# Patient Record
Sex: Female | Born: 1975 | Race: White | Hispanic: No | Marital: Married | Smoking: Never smoker
Health system: Southern US, Community
[De-identification: ages and names within clinical notes are randomized; demographics above are authoritative.]

## PROBLEM LIST (undated history)

## (undated) DIAGNOSIS — K219 Gastro-esophageal reflux disease without esophagitis: Secondary | ICD-10-CM

---

## 2004-05-30 ENCOUNTER — Other Ambulatory Visit: Admission: RE | Admit: 2004-05-30 | Discharge: 2004-05-30 | Payer: Self-pay | Admitting: Obstetrics and Gynecology

## 2005-11-01 ENCOUNTER — Inpatient Hospital Stay (HOSPITAL_COMMUNITY): Admission: AD | Admit: 2005-11-01 | Discharge: 2005-11-05 | Payer: Self-pay | Admitting: Obstetrics and Gynecology

## 2010-09-06 LAB — RUBELLA ANTIBODY, IGM: Rubella: IMMUNE

## 2010-09-06 LAB — ABO/RH: RH Type: POSITIVE

## 2010-09-06 LAB — GC/CHLAMYDIA PROBE AMP, GENITAL: Gonorrhea: NEGATIVE

## 2011-03-26 ENCOUNTER — Encounter (HOSPITAL_COMMUNITY)
Admission: RE | Admit: 2011-03-26 | Discharge: 2011-03-26 | Disposition: A | Discharge status: Home / Self Care | Payer: BC Managed Care – PPO | Source: Ambulatory Visit | Attending: Obstetrics and Gynecology | Admitting: Obstetrics and Gynecology

## 2011-03-26 ENCOUNTER — Encounter (HOSPITAL_COMMUNITY): Payer: Self-pay

## 2011-03-26 HISTORY — DX: Gastro-esophageal reflux disease without esophagitis: K21.9

## 2011-03-26 LAB — CBC
HCT: 41.5 % (ref 36.0–46.0)
MCHC: 33.3 g/dL (ref 30.0–36.0)
RDW: 13.1 % (ref 11.5–15.5)

## 2011-03-26 LAB — SURGICAL PCR SCREEN: MRSA, PCR: NEGATIVE

## 2011-03-26 NOTE — Patient Instructions (Addendum)
YOUR PROCEDURE IS SCHEDULED ON: 03/29/11  ENTER THROUGH THE MAIN ENTRANCE OF [facility] AT:6am  USE DESK PHONE AND DIAL [phone] TO INFORM us OF YOUR ARRIVAL  CALL [phone] IF YOU HAVE ANY QUESTIONS OR PROBLEMS PRIOR TO YOUR ARRIVAL.  REMEMBER: DO NOT EAT OR DRINK AFTER MIDNIGHT :Wed  SPECIAL INSTRUCTIONS:   YOU MAY BRUSH YOUR TEETH THE MORNING OF SURGERY   TAKE THESE MEDICINES THE DAY OF SURGERY WITH SIP OF WATER: none   DO NOT WEAR JEWELRY, EYE MAKEUP, LIPSTICK OR DARK FINGERNAIL POLISH DO NOT WEAR LOTIONS  DO NOT SHAVE FOR 48 HOURS PRIOR TO SURGERY  YOU WILL NOT BE ALLOWED TO DRIVE YOURSELF HOME.  NAME OF DRIVER: Elijah Birk- spouse

## 2011-03-27 LAB — RPR: RPR Ser Ql: NONREACTIVE

## 2011-03-27 NOTE — H&P (Addendum)
Tracy Owen is a 36 y.o. female G2P1001 at 39+weeks (EDD 04/05/11 by 9 week [place]) presenting for scheduled repeat c-section, declining TOL.  Prenatal course has been releatively uneventful.  SHe had some fundal lag, but US showed baby at the 24%ile with normal AFI on 03/13/11.  She also had a placenta previa that resolved on a 28 week scan.  History OB History    Grav Para Term Preterm Abortions TAB SAB Ect Mult Living   2 1        1     LTCS 2007  NRFHR 7#  Past Medical History  Diagnosis Date  . GERD (gastroesophageal reflux disease)     no meds- pregnancy related   Past Surgical History  Procedure Date  . Cesarean section 2007   Family History: family history is not on file. Social History:  reports that she has never smoked. She does not have any smokeless tobacco history on file. She reports that she does not use illicit drugs. Her alcohol history not on file.  ROS    Last menstrual period 07/04/2010. Maternal Exam:  Uterine Assessment: Contraction strength is mild.  Contraction frequency is irregular.   Abdomen: Patient reports no abdominal tenderness. Fetal presentation: vertex  Introitus: Normal vulva. Normal vagina.    Physical Exam  Constitutional: She is oriented to person, place, and time. She appears well-developed and well-nourished.  Cardiovascular: Normal rate and regular rhythm.   Respiratory: Effort normal and breath sounds normal.  GI: Soft. Bowel sounds are normal.  Genitourinary: Vagina normal.       Cervix 50/1/-2  Neurological: She is alert and oriented to person, place, and time.  Psychiatric: She has a normal mood and affect. Her behavior is normal.    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  Risks of bleeding, infection and possible damage to bowel and bladder d/w her.  She desires to proceed.  Oliver Pila 03/27/2011, 11:10 PM   Per patient no changes in dictated H&P and brief exam WNL.

## 2011-03-28 MED ORDER — CEFAZOLIN SODIUM-DEXTROSE 2-3 GM-% IV SOLR
2.0000 g | INTRAVENOUS | Status: AC
  Administered 2011-03-29: 2 g via INTRAVENOUS
  Filled 2011-03-28: qty 50

## 2011-03-29 ENCOUNTER — Encounter (HOSPITAL_COMMUNITY): Payer: Self-pay | Admitting: *Deleted

## 2011-03-29 ENCOUNTER — Inpatient Hospital Stay (HOSPITAL_COMMUNITY): Payer: BC Managed Care – PPO | Admitting: Anesthesiology

## 2011-03-29 ENCOUNTER — Inpatient Hospital Stay (HOSPITAL_COMMUNITY)
Admission: RE | Admit: 2011-03-29 | Discharge: 2011-03-31 | DRG: 371 | Disposition: A | Discharge status: Home / Self Care | Payer: BC Managed Care – PPO | Source: Ambulatory Visit | Attending: Obstetrics and Gynecology | Admitting: Obstetrics and Gynecology

## 2011-03-29 ENCOUNTER — Encounter (HOSPITAL_COMMUNITY)
Admission: RE | Disposition: A | Discharge status: Home / Self Care | Payer: Self-pay | Source: Ambulatory Visit | Attending: Obstetrics and Gynecology

## 2011-03-29 ENCOUNTER — Encounter (HOSPITAL_COMMUNITY): Payer: Self-pay | Admitting: Anesthesiology

## 2011-03-29 DIAGNOSIS — Z98891 History of uterine scar from previous surgery: Secondary | ICD-10-CM

## 2011-03-29 DIAGNOSIS — O34219 Maternal care for unspecified type scar from previous cesarean delivery: Principal | ICD-10-CM | POA: Diagnosis present

## 2011-03-29 LAB — ABO/RH: ABO/RH(D): O POS

## 2011-03-29 LAB — TYPE AND SCREEN: ABO/RH(D): O POS

## 2011-03-29 SURGERY — Surgical Case
Anesthesia: Choice | Wound class: Clean Contaminated

## 2011-03-29 MED ORDER — OXYTOCIN 10 UNIT/ML IJ SOLN
INTRAMUSCULAR | Status: AC
  Filled 2011-03-29: qty 2

## 2011-03-29 MED ORDER — FENTANYL CITRATE 0.05 MG/ML IJ SOLN
INTRAMUSCULAR | Status: AC
  Filled 2011-03-29: qty 2

## 2011-03-29 MED ORDER — SIMETHICONE 80 MG PO CHEW: 80.0000 mg | CHEWABLE_TABLET | ORAL | Status: DC | PRN

## 2011-03-29 MED ORDER — SIMETHICONE 80 MG PO CHEW
80.0000 mg | CHEWABLE_TABLET | Freq: Three times a day (TID) | ORAL | Status: DC
  Administered 2011-03-29 – 2011-03-31 (×6): 80 mg via ORAL

## 2011-03-29 MED ORDER — IBUPROFEN 600 MG PO TABS
600.0000 mg | ORAL_TABLET | Freq: Four times a day (QID) | ORAL | Status: DC | PRN
  Filled 2011-03-29 (×4): qty 1

## 2011-03-29 MED ORDER — SCOPOLAMINE 1 MG/3DAYS TD PT72
1.0000 | MEDICATED_PATCH | Freq: Once | TRANSDERMAL | Status: DC
  Administered 2011-03-29: 1.5 mg via TRANSDERMAL

## 2011-03-29 MED ORDER — PROMETHAZINE HCL 25 MG/ML IJ SOLN: 6.2500 mg | INTRAMUSCULAR | Status: DC | PRN

## 2011-03-29 MED ORDER — KETOROLAC TROMETHAMINE 30 MG/ML IJ SOLN
30.0000 mg | Freq: Four times a day (QID) | INTRAMUSCULAR | Status: AC | PRN
  Administered 2011-03-29: 30 mg via INTRAMUSCULAR

## 2011-03-29 MED ORDER — ONDANSETRON HCL 4 MG/2ML IJ SOLN
INTRAMUSCULAR | Status: DC | PRN
  Administered 2011-03-29: 4 mg via INTRAVENOUS

## 2011-03-29 MED ORDER — ONDANSETRON HCL 4 MG/2ML IJ SOLN: 4.0000 mg | INTRAMUSCULAR | Status: DC | PRN

## 2011-03-29 MED ORDER — MIDAZOLAM HCL 2 MG/2ML IJ SOLN: 0.5000 mg | Freq: Once | INTRAMUSCULAR | Status: DC | PRN

## 2011-03-29 MED ORDER — DIPHENHYDRAMINE HCL 50 MG/ML IJ SOLN
12.5000 mg | INTRAMUSCULAR | Status: DC | PRN
  Administered 2011-03-29: 12.5 mg via INTRAVENOUS
  Filled 2011-03-29: qty 1

## 2011-03-29 MED ORDER — MEPERIDINE HCL 25 MG/ML IJ SOLN
6.2500 mg | INTRAMUSCULAR | Status: DC | PRN
  Administered 2011-03-29: 6.25 mg via INTRAVENOUS

## 2011-03-29 MED ORDER — DIPHENHYDRAMINE HCL 25 MG PO CAPS
25.0000 mg | ORAL_CAPSULE | ORAL | Status: DC | PRN
  Administered 2011-03-30: 25 mg via ORAL
  Filled 2011-03-29: qty 1

## 2011-03-29 MED ORDER — KETOROLAC TROMETHAMINE 30 MG/ML IJ SOLN: 30.0000 mg | Freq: Four times a day (QID) | INTRAMUSCULAR | Status: AC | PRN

## 2011-03-29 MED ORDER — ONDANSETRON HCL 4 MG PO TABS: 4.0000 mg | ORAL_TABLET | ORAL | Status: DC | PRN

## 2011-03-29 MED ORDER — PRENATAL MULTIVITAMIN CH
1.0000 | ORAL_TABLET | Freq: Every day | ORAL | Status: DC
  Administered 2011-03-31: 1 via ORAL
  Filled 2011-03-29: qty 1

## 2011-03-29 MED ORDER — PHENYLEPHRINE HCL 10 MG/ML IJ SOLN
INTRAMUSCULAR | Status: DC | PRN
  Administered 2011-03-29 (×2): 40 ug via INTRAVENOUS

## 2011-03-29 MED ORDER — 0.9 % SODIUM CHLORIDE (POUR BTL) OPTIME
TOPICAL | Status: DC | PRN
  Administered 2011-03-29: 1000 mL

## 2011-03-29 MED ORDER — ACETAMINOPHEN 10 MG/ML IV SOLN
1000.0000 mg | Freq: Four times a day (QID) | INTRAVENOUS | Status: DC | PRN
  Filled 2011-03-29: qty 100

## 2011-03-29 MED ORDER — OXYTOCIN 10 UNIT/ML IJ SOLN
INTRAMUSCULAR | Status: DC | PRN
  Administered 2011-03-29: 20 [IU]

## 2011-03-29 MED ORDER — KETOROLAC TROMETHAMINE 30 MG/ML IJ SOLN
INTRAMUSCULAR | Status: AC
  Administered 2011-03-29: 30 mg via INTRAMUSCULAR
  Filled 2011-03-29: qty 1

## 2011-03-29 MED ORDER — FENTANYL CITRATE 0.05 MG/ML IJ SOLN
INTRAMUSCULAR | Status: DC | PRN
  Administered 2011-03-29: 25 ug via INTRATHECAL

## 2011-03-29 MED ORDER — METOCLOPRAMIDE HCL 5 MG/ML IJ SOLN: 10.0000 mg | Freq: Three times a day (TID) | INTRAMUSCULAR | Status: DC | PRN

## 2011-03-29 MED ORDER — IBUPROFEN 600 MG PO TABS
600.0000 mg | ORAL_TABLET | Freq: Four times a day (QID) | ORAL | Status: DC
  Administered 2011-03-29 – 2011-03-31 (×8): 600 mg via ORAL
  Filled 2011-03-29 (×4): qty 1

## 2011-03-29 MED ORDER — OXYTOCIN 10 UNIT/ML IJ SOLN
INTRAMUSCULAR | Status: AC
  Filled 2011-03-29: qty 1

## 2011-03-29 MED ORDER — WITCH HAZEL-GLYCERIN EX PADS: 1.0000 "application " | MEDICATED_PAD | CUTANEOUS | Status: DC | PRN

## 2011-03-29 MED ORDER — ONDANSETRON HCL 4 MG/2ML IJ SOLN: 4.0000 mg | Freq: Three times a day (TID) | INTRAMUSCULAR | Status: DC | PRN

## 2011-03-29 MED ORDER — FENTANYL CITRATE 0.05 MG/ML IJ SOLN: 25.0000 ug | INTRAMUSCULAR | Status: DC | PRN

## 2011-03-29 MED ORDER — MEPERIDINE HCL 25 MG/ML IJ SOLN
INTRAMUSCULAR | Status: AC
  Administered 2011-03-29: 6.25 mg via INTRAVENOUS
  Filled 2011-03-29: qty 1

## 2011-03-29 MED ORDER — ACETAMINOPHEN 325 MG PO TABS: 325.0000 mg | ORAL_TABLET | ORAL | Status: DC | PRN

## 2011-03-29 MED ORDER — NALBUPHINE HCL 10 MG/ML IJ SOLN
5.0000 mg | INTRAMUSCULAR | Status: DC | PRN
  Filled 2011-03-29: qty 1

## 2011-03-29 MED ORDER — ZOLPIDEM TARTRATE 5 MG PO TABS: 5.0000 mg | ORAL_TABLET | Freq: Every evening | ORAL | Status: DC | PRN

## 2011-03-29 MED ORDER — MEPERIDINE HCL 25 MG/ML IJ SOLN: 6.2500 mg | INTRAMUSCULAR | Status: DC | PRN

## 2011-03-29 MED ORDER — DIBUCAINE 1 % RE OINT: 1.0000 "application " | TOPICAL_OINTMENT | RECTAL | Status: DC | PRN

## 2011-03-29 MED ORDER — LANOLIN HYDROUS EX OINT: 1.0000 "application " | TOPICAL_OINTMENT | CUTANEOUS | Status: DC | PRN

## 2011-03-29 MED ORDER — MENTHOL 3 MG MT LOZG: 1.0000 | LOZENGE | OROMUCOSAL | Status: DC | PRN

## 2011-03-29 MED ORDER — MORPHINE SULFATE 0.5 MG/ML IJ SOLN
INTRAMUSCULAR | Status: AC
  Filled 2011-03-29: qty 10

## 2011-03-29 MED ORDER — LACTATED RINGERS IV SOLN
INTRAVENOUS | Status: DC
  Administered 2011-03-29 (×2): via INTRAVENOUS

## 2011-03-29 MED ORDER — OXYTOCIN 20 UNITS IN LACTATED RINGERS INFUSION - SIMPLE: 125.0000 mL/h | INTRAVENOUS | Status: AC

## 2011-03-29 MED ORDER — PHENYLEPHRINE 40 MCG/ML (10ML) SYRINGE FOR IV PUSH (FOR BLOOD PRESSURE SUPPORT)
PREFILLED_SYRINGE | INTRAVENOUS | Status: AC
  Filled 2011-03-29: qty 5

## 2011-03-29 MED ORDER — LACTATED RINGERS IV SOLN
INTRAVENOUS | Status: DC
  Administered 2011-03-29: 07:00:00 via INTRAVENOUS

## 2011-03-29 MED ORDER — SENNOSIDES-DOCUSATE SODIUM 8.6-50 MG PO TABS
2.0000 | ORAL_TABLET | Freq: Every day | ORAL | Status: DC
  Administered 2011-03-29 – 2011-03-30 (×2): 2 via ORAL

## 2011-03-29 MED ORDER — ONDANSETRON HCL 4 MG/2ML IJ SOLN
INTRAMUSCULAR | Status: AC
  Filled 2011-03-29: qty 2

## 2011-03-29 MED ORDER — DIPHENHYDRAMINE HCL 50 MG/ML IJ SOLN: 25.0000 mg | INTRAMUSCULAR | Status: DC | PRN

## 2011-03-29 MED ORDER — OXYCODONE-ACETAMINOPHEN 5-325 MG PO TABS
1.0000 | ORAL_TABLET | ORAL | Status: DC | PRN
  Administered 2011-03-31: 1 via ORAL
  Filled 2011-03-29: qty 1

## 2011-03-29 MED ORDER — SODIUM CHLORIDE 0.9 % IV SOLN
1.0000 ug/kg/h | INTRAVENOUS | Status: DC | PRN
  Filled 2011-03-29: qty 2.5

## 2011-03-29 MED ORDER — LACTATED RINGERS IV SOLN
INTRAVENOUS | Status: DC
  Administered 2011-03-29: 16:00:00 via INTRAVENOUS

## 2011-03-29 MED ORDER — NALOXONE HCL 0.4 MG/ML IJ SOLN: 0.4000 mg | INTRAMUSCULAR | Status: DC | PRN

## 2011-03-29 MED ORDER — DIPHENHYDRAMINE HCL 25 MG PO CAPS
25.0000 mg | ORAL_CAPSULE | Freq: Four times a day (QID) | ORAL | Status: DC | PRN
  Administered 2011-03-29: 25 mg via ORAL
  Filled 2011-03-29: qty 1

## 2011-03-29 MED ORDER — SODIUM CHLORIDE 0.9 % IJ SOLN: 3.0000 mL | INTRAMUSCULAR | Status: DC | PRN

## 2011-03-29 MED ORDER — MORPHINE SULFATE (PF) 0.5 MG/ML IJ SOLN
INTRAMUSCULAR | Status: DC | PRN
  Administered 2011-03-29: .15 mg via INTRATHECAL

## 2011-03-29 MED ORDER — TETANUS-DIPHTH-ACELL PERTUSSIS 5-2.5-18.5 LF-MCG/0.5 IM SUSP
0.5000 mL | Freq: Once | INTRAMUSCULAR | Status: AC
  Administered 2011-03-30: 0.5 mL via INTRAMUSCULAR
  Filled 2011-03-29 (×2): qty 0.5

## 2011-03-29 SURGICAL SUPPLY — 41 items
APL SKNCLS STERI-STRIP NONHPOA (GAUZE/BANDAGES/DRESSINGS) ×1
BENZOIN TINCTURE PRP APPL 2/3 (GAUZE/BANDAGES/DRESSINGS) ×1 IMPLANT
CHLORAPREP W/TINT 26ML (MISCELLANEOUS) ×2 IMPLANT
CLOTH BEACON ORANGE TIMEOUT ST (SAFETY) ×2 IMPLANT
CONTAINER PREFILL 10% NBF 15ML (MISCELLANEOUS) IMPLANT
DRSG COVADERM 4X10 (GAUZE/BANDAGES/DRESSINGS) ×1 IMPLANT
ELECT REM PT RETURN 9FT ADLT (ELECTROSURGICAL) ×2
ELECTRODE REM PT RTRN 9FT ADLT (ELECTROSURGICAL) ×1 IMPLANT
EXTRACTOR VACUUM KIWI (MISCELLANEOUS) IMPLANT
EXTRACTOR VACUUM M CUP 4 TUBE (SUCTIONS) IMPLANT
GAUZE SPONGE 4X4 12PLY STRL LF (GAUZE/BANDAGES/DRESSINGS) ×1 IMPLANT
GLOVE BIO SURGEON STRL SZ 6.5 (GLOVE) ×4 IMPLANT
GLOVE BIO SURGEON STRL SZ8 (GLOVE) ×3 IMPLANT
GLOVE SURG ORTHO 8.0 STRL STRW (GLOVE) ×1 IMPLANT
GOWN PREVENTION PLUS LG XLONG (DISPOSABLE) ×4 IMPLANT
GOWN SURG XXL (GOWNS) ×1 IMPLANT
GOWN SURGICAL XLG (GOWNS) ×2 IMPLANT
KIT ABG SYR 3ML LUER SLIP (SYRINGE) IMPLANT
NEEDLE HYPO 25X5/8 SAFETYGLIDE (NEEDLE) ×2 IMPLANT
NS IRRIG 1000ML POUR BTL (IV SOLUTION) ×2 IMPLANT
PACK C SECTION WH (CUSTOM PROCEDURE TRAY) ×2 IMPLANT
PAD ABD 7.5X8 STRL (GAUZE/BANDAGES/DRESSINGS) ×2 IMPLANT
RTRCTR C-SECT PINK 25CM LRG (MISCELLANEOUS) ×2 IMPLANT
SLEEVE SCD COMPRESS KNEE MED (MISCELLANEOUS) IMPLANT
STAPLER VISISTAT 35W (STAPLE) IMPLANT
STRIP CLOSURE SKIN 1/2X4 (GAUZE/BANDAGES/DRESSINGS) ×1 IMPLANT
SUT CHROMIC 1 CTX 36 (SUTURE) ×4 IMPLANT
SUT PLAIN 0 NONE (SUTURE) IMPLANT
SUT PLAIN 2 0 XLH (SUTURE) IMPLANT
SUT VIC AB 0 CT1 27 (SUTURE) ×4
SUT VIC AB 0 CT1 27XBRD ANBCTR (SUTURE) ×2 IMPLANT
SUT VIC AB 2-0 CT1 27 (SUTURE)
SUT VIC AB 2-0 CT1 TAPERPNT 27 (SUTURE) IMPLANT
SUT VIC AB 3-0 CT1 27 (SUTURE) ×2
SUT VIC AB 3-0 CT1 TAPERPNT 27 (SUTURE) IMPLANT
SUT VIC AB 3-0 SH 27 (SUTURE) ×4
SUT VIC AB 3-0 SH 27X BRD (SUTURE) IMPLANT
SUT VIC AB 4-0 KS 27 (SUTURE) IMPLANT
TOWEL OR 17X24 6PK STRL BLUE (TOWEL DISPOSABLE) ×4 IMPLANT
TRAY FOLEY CATH 14FR (SET/KITS/TRAYS/PACK) ×2 IMPLANT
WATER STERILE IRR 1000ML POUR (IV SOLUTION) ×2 IMPLANT

## 2011-03-29 NOTE — Op Note (Signed)
Operative note  Preoperative diagnosis Term pregnancy at 39 weeks Prior C-section declines VBAC  Postoperative diagnosis Same  Procedure Repeat low transverse C-section with 2 layer closure of uterus  Surgeon Dr. Huel Cote Dr. Tracey Harries  Anesthesia Spinal  Fluids Estimated blood loss 600 cc Urine output 250 cc IV fluid 2200 cc  Findings A viable female infant in the vertex presentation. Apgars were 8 and 9. Weight pending at the time of dictation. Patient had normal uterus tubes and ovaries noted. The lower uterine segment was quite thin and did require reinforcing at the midline of the incision.  Specimen Placenta sent to L&D  Procedure note After informed consent was obtained from the patient she was taken to the operating room where spinal anesthesia was obtained without difficulty. She was then prepped and draped in the normal sterile fashion in the dorsal supine position with a leftward tilt and an appropriate time out performed. A Pfannenstiel skin incision was then made through pre-existing scar and carried through to the underlying layer of fascia by sharp dissection and Bovie cautery. The fascia was then nicked in the midline and the incision was extended laterally with Mayo scissors. The inferior aspect of the incision was grasped with Coker clamps elevated and dissected off the underlying rectus muscles. In a similar fashion the superior aspect was dissected off the rectus muscles. These were separated in the midline and the peritoneal cavity entered sharply. Peritoneal incision was then extended both superiorly and inferiorly with careful attention to avoid both bowel bladder. The Alexis self-retaining wound retractor was placed within the incision and the lower uterine segment was exposed nicely. The lower uterine segment was then incised in a transverse fashion after the bladder flap was created with Metzenbaum scissors. The uterine cavity itself was entered  bluntly and the infant's head was delivered atraumatically. Nose and mouth bulb suctioned and the remainder of the body easily delivered with the cord clamped and cut and infant handed to the waiting pediatricians. The uterus was then massaged and the placenta expressed spontaneously and handed off to pathology. The uterus was also cleaned of all clots and debris with moist lap sponge. The uterus was somewhat boggy but did respond quickly to IV Pitocin and massage. The incision was then closed with 1-0 chromic in a running locked fashion in the first layer and a second layer of the same suture imbricating. At the midline of the incision it was noted that there was some pulling through the lower uterine segment where it was quite thin. This was additionally reinforced with 2 layers of 2-0 Vicryl on SH needle and good approximation noted. All appeared hemostatic so the tubes and ovaries were inspected bilaterally and found to be normal and the gutters cleared of all clots and debris. The incision once again appeared clear and therefore the Alexis retractor and all instruments and sponges were removed from the abdomen. The rectus muscles and peritoneum were reapproximated were several interrupted mattress sutures of 2-0 Vicryl. The fascia was closed with 0 Vicryl in a running fashion the subcutaneous tissue was reapproximated with 2 plain in a running fashion. The skin was closed with 4-0 Vicryl in a subcuticular stitch on a Keith needle. Again all instruments and sponge counts were correct and the patient was taken to the recovery room in good condition.

## 2011-03-29 NOTE — Brief Op Note (Signed)
03/29/2011  8:35 AM  PATIENT:  Tracy Owen  36 y.o. female  PRE-OPERATIVE DIAGNOSIS:  Previous cesarean section, declines VBAC                                                       Term pregnancy at 20 weeks  POST-OPERATIVE DIAGNOSIS:  same  PROCEDURE:  Procedure(s) (LRB): Repeat Low transverse CESAREAN SECTION (N/A) with two layer closure of uterus  SURGEON:  Surgeon(s) and Role:    * Oliver Pila, MD - Primary       Tracey Harries, MD-assist  ANESTHESIA:   spinal  EBL:  Total I/O In: 2000 [I.V.:2000] Out: 850 [Urine:250; Blood:600]  BLOOD ADMINISTERED:none  DRAINS: Urinary Catheter (Foley)    SPECIMEN:  Placenta  DISPOSITION OF SPECIMEN:  L&D  COUNTS:  YES  DICTATION: .Dragon Dictation  PLAN OF CARE: Admit to inpatient   PATIENT DISPOSITION:  PACU - hemodynamically stable.

## 2011-03-29 NOTE — Anesthesia Preprocedure Evaluation (Signed)
Anesthesia Evaluation  Patient identified by MRN, date of birth, ID band Patient awake    Reviewed: Allergy & Precautions, H&P , NPO status , Patient's Chart, lab work & pertinent test results  Airway Mallampati: II      Dental No notable dental hx.    Pulmonary neg pulmonary ROS,  breath sounds clear to auscultation  Pulmonary exam normal       Cardiovascular Exercise Tolerance: Good negative cardio ROS  Rhythm:regular Rate:Normal     Neuro/Psych negative neurological ROS  negative psych ROS   GI/Hepatic negative GI ROS, Neg liver ROS, GERD-  Controlled,  Endo/Other  negative endocrine ROS  Renal/GU negative Renal ROS  negative genitourinary   Musculoskeletal   Abdominal Normal abdominal exam  (+)   Peds  Hematology negative hematology ROS (+)   Anesthesia Other Findings   Reproductive/Obstetrics (+) Pregnancy                           Anesthesia Physical Anesthesia Plan  ASA: II  Anesthesia Plan: Spinal   Post-op Pain Management:    Induction:   Airway Management Planned:   Additional Equipment:   Intra-op Plan:   Post-operative Plan:   Informed Consent: I have reviewed the patients History and Physical, chart, labs and discussed the procedure including the risks, benefits and alternatives for the proposed anesthesia with the patient or authorized representative who has indicated his/her understanding and acceptance.     Plan Discussed with: Anesthesiologist, CRNA and Surgeon  Anesthesia Plan Comments:         Anesthesia Quick Evaluation

## 2011-03-29 NOTE — Anesthesia Procedure Notes (Signed)

## 2011-03-29 NOTE — Anesthesia Postprocedure Evaluation (Signed)
  Anesthesia Post-op Note  Patient: Tracy Owen  Procedure(s) Performed: Procedure(s) (LRB): CESAREAN SECTION (N/A)  Patient Location: PACU  Anesthesia Type: Spinal  Level of Consciousness: awake, alert  and oriented  Airway and Oxygen Therapy: Patient Spontanous Breathing  Post-op Pain: none  Post-op Assessment: Post-op Vital signs reviewed, Patient's Cardiovascular Status Stable, Respiratory Function Stable, Patent Airway, No signs of Nausea or vomiting and Pain level controlled  Post-op Vital Signs: Reviewed and stable  Complications: No apparent anesthesia complications

## 2011-03-29 NOTE — Transfer of Care (Signed)
Immediate Anesthesia Transfer of Care Note  Patient: Tracy Owen  Procedure(s) Performed: Procedure(s) (LRB): CESAREAN SECTION (N/A)  Patient Location: PACU  Anesthesia Type: Spinal  Level of Consciousness: awake, alert  and oriented  Airway & Oxygen Therapy: Patient Spontanous Breathing  Post-op Assessment: Report given to PACU RN and Post -op Vital signs reviewed and stable  Post vital signs: stable  Complications: No apparent anesthesia complications

## 2011-03-30 LAB — CBC
HCT: 35.4 % — ABNORMAL LOW (ref 36.0–46.0)
Hemoglobin: 11.8 g/dL — ABNORMAL LOW (ref 12.0–15.0)
MCHC: 33.3 g/dL (ref 30.0–36.0)
RBC: 3.92 MIL/uL (ref 3.87–5.11)

## 2011-03-30 NOTE — Progress Notes (Signed)
Patient ID: Tracy Owen, female   DOB: 05-Jul-1975, 36 y.o.   MRN: [id number] #1 afebrile VS normal Pt has no complaints. Hgb stable.

## 2011-03-30 NOTE — Addendum Note (Signed)
Addendum  created 03/30/11 1335 by Jhonnie Garner, CRNA   Modules edited:Notes Section

## 2011-03-30 NOTE — Anesthesia Postprocedure Evaluation (Signed)
Anesthesia Post Note  Patient: Tracy Owen  Procedure(s) Performed: Procedure(s) (LRB): CESAREAN SECTION (N/A)  Anesthesia type: SAB  Patient location: Mother/Baby  Post pain: Pain level controlled  Post assessment: Post-op Vital signs reviewed  Last Vitals:  Filed Vitals:   03/30/11 1334  BP: 120/82  Pulse: 76  Temp: 36.6 C  Resp: 19    Post vital signs: Reviewed  Level of consciousness: awake  Complications: No apparent anesthesia complications

## 2011-03-31 ENCOUNTER — Encounter (HOSPITAL_COMMUNITY): Payer: Self-pay | Admitting: Obstetrics and Gynecology

## 2011-03-31 MED ORDER — OXYCODONE-ACETAMINOPHEN 5-325 MG PO TABS: 1.0000 | ORAL_TABLET | ORAL | Status: AC | PRN

## 2011-03-31 MED ORDER — IBUPROFEN 600 MG PO TABS: 600.0000 mg | ORAL_TABLET | Freq: Four times a day (QID) | ORAL | Status: AC | PRN

## 2011-03-31 NOTE — Progress Notes (Signed)
Patient ID: Tracy Owen, female   DOB: [birth year], 36 y.o.   MRN: [id number] #2 afebrile BP 130/92 no HA or epigastric pain. Wants d/c Have advised her to take her BP 4x per day and call with HA, epigastric pain or BP > 160/100.

## 2011-03-31 NOTE — Discharge Summary (Signed)
Tracy Owen, STIEBER NO.:  [id number]  MEDICAL RECORD NO.:  [id number]  LOCATION:  9138                          FACILITY:  [facility]  PHYSICIAN:  Malachi Pro. Ambrose Mantle, M.D. DATE OF BIRTH:  April 21, 1975  DATE OF ADMISSION:  03/29/2011 DATE OF DISCHARGE:  03/31/2011                              DISCHARGE SUMMARY   This is a 36 year old white female, para 1-0-0-1, gravida 2, EDC April 05, 2011 by 9 week ultrasound, presented for scheduled repeat C-section. Declined a trial of labor.  Prenatal course was uneventful.  She had some fundal lag, but the ultrasound showed the baby at the 24th percentile with normal amniotic fluid on March 13, 2011.  She had a placenta previa that resolved on the 28-week scan.  PAST MEDICAL HISTORY:  GERD.  SURGICAL HISTORY:  C-section.  FAMILY HISTORY:  Not on file.  She has never smoked.  She did not drink or use illicit drugs.   On admission, her cervix was 1 cm, 50%, vertex, -2 station.  Blood group and type O positive, negative antibody, rubella immune, RPR nonreactive, hepatitis B surface antigen negative, HIV negative, GC and Chlamydia not reported, group B strep negative, declined genetic screens, 1-hour Glucola 149, 3-hour GTT normal, cystic fibrosis carrier, but husband negative.  On March 29, 2011, the patient underwent a low-transverse cervical C-section by Dr. Senaida Ores with Dr. Ambrose Mantle assisting under spinal anesthesia.  The uterus was closed in 2 layers.  The infant was female, Apgars were 8 and 9 at 1 and 5 minutes, and weight was not available at the time of dictation.  Postoperatively, the patient did quite well and was discharged on the 2nd postoperative day.  LABORATORY DATA:  Initial hemoglobin of 13.0 hematocrit 41.5 white count 8200, platelet count 206,000.  Followup hemoglobin 11.8.  RPR was nonreactive.  FINAL DIAGNOSES:  Intrauterine pregnancy at 39 weeks, delivered vertex by repeat C-section, prior C-section.   Declined vaginal birth after cesarean.  OPERATION:  Low-transverse cervical C-section.  FINAL CONDITION:  Improved.  The patient's last blood pressure was 130/92, so she is counseled to take her blood pressure 4 times a day, report if it is greater than 160/100 and report any headache or epigastric pain.  The patient is given our routine discharge instructions, Percocet 5/325, 15 tablets, 1 every 6 hours as needed for pain and Motrin 600 mg, 30 tablets, 1 every 6 hours as needed for pain.  The patient is advised to return to the office in 2 weeks for followup examination.  She is planning on having a Mirena inserted.  I have instructed her to avoid intercourse for 6 weeks.  To call the office and make sure her benefits are acceptable for the Mirena and to return and see Dr. Senaida Ores in 2 weeks for followup examination.     Malachi Pro. Ambrose Mantle, M.D.     TFH/MEDQ  D:  03/31/2011  T:  03/31/2011  Job:  [id number]

## 2011-03-31 NOTE — Discharge Instructions (Signed)
booklet °

## 2012-04-22 ENCOUNTER — Other Ambulatory Visit: Payer: Self-pay | Admitting: Obstetrics and Gynecology

## 2012-04-22 DIAGNOSIS — Z1231 Encounter for screening mammogram for malignant neoplasm of breast: Secondary | ICD-10-CM

## 2012-08-05 ENCOUNTER — Other Ambulatory Visit: Payer: Self-pay

## 2012-08-05 ENCOUNTER — Other Ambulatory Visit: Payer: Self-pay | Admitting: Family Medicine

## 2012-08-05 DIAGNOSIS — R300 Dysuria: Secondary | ICD-10-CM

## 2012-08-05 NOTE — Progress Notes (Signed)
Patient reports 2 days of dysuria, frequency, urgency. Urinalysis is significant for large amounts of blood, large amount of leukocyte esterase. I will send a urine culture. Meanwhile treat with Cipro 500 mg by mouth twice a day for 5 days.

## 2012-08-07 LAB — URINE CULTURE: Colony Count: >=100000

## 2013-11-02 ENCOUNTER — Encounter (HOSPITAL_COMMUNITY): Payer: Self-pay | Admitting: Obstetrics and Gynecology

## 2016-12-20 ENCOUNTER — Ambulatory Visit (INDEPENDENT_AMBULATORY_CARE_PROVIDER_SITE_OTHER): Payer: BLUE CROSS/BLUE SHIELD | Admitting: Podiatry

## 2016-12-20 ENCOUNTER — Encounter: Payer: Self-pay | Admitting: Podiatry

## 2016-12-20 VITALS — BP 113/71 | HR 88 | Resp 16

## 2016-12-20 DIAGNOSIS — L859 Epidermal thickening, unspecified: Secondary | ICD-10-CM | POA: Diagnosis not present

## 2016-12-20 DIAGNOSIS — B070 Plantar wart: Secondary | ICD-10-CM | POA: Diagnosis not present

## 2016-12-20 DIAGNOSIS — N9089 Other specified noninflammatory disorders of vulva and perineum: Secondary | ICD-10-CM | POA: Insufficient documentation

## 2016-12-20 NOTE — Progress Notes (Signed)
  Subjective:  Patient ID: Tracy Owen, female    DOB: 08-Jun-1975,  MRN: [id number] HPI Chief Complaint  Patient presents with  . Callouses    Sub 5th MPJ right - callused area x several months, tried trimming and using OTC wart meds-no help, walking makes tender    41 y.o. female presents with the above complaint.    Past Medical History:  Diagnosis Date  . GERD (gastroesophageal reflux disease)    no meds- pregnancy related     Current Outpatient Medications:  .  levonorgestrel (MIRENA, 52 MG,) 20 MCG/24HR IUD, Mirena 20 mcg/24 hr (5 years) intrauterine device  Take 1 device by intrauterine route., Disp: , Rfl:   Allergies  Allergen Reactions  . Latex   . Sulfa Antibiotics Rash   Review of Systems  All other systems reviewed and are negative.  Objective:   Vitals:   12/20/16 1543  BP: 113/71  Pulse: 88  Resp: 16    General: Well developed, nourished, in no acute distress, alert and oriented x3   Dermatological: Skin is warm, dry and supple bilateral. Nails x 10 are well maintained; remaining integument appears unremarkable at this time. There are no open sores, no preulcerative lesions, no rash or signs of infection present.  Vascular: Dorsalis Pedis artery and Posterior Tibial artery pedal pulses are 2/4 bilateral with immedate capillary fill time. Pedal hair growth present. No varicosities and no lower extremity edema present bilateral.   Neruologic: Grossly intact via light touch bilateral. Vibratory intact via tuning fork bilateral. Protective threshold with Semmes Wienstein monofilament intact to all pedal sites bilateral. Patellar and Achilles deep tendon reflexes 2+ bilateral. No Babinski or clonus noted bilateral.   Musculoskeletal: No gross boney pedal deformities bilateral. No pain, crepitus, or limitation noted with foot and ankle range of motion bilateral. Muscular strength 5/5 in all groups tested bilateral.  Gait: Unassisted, Nonantalgic.     Radiographs:  None taken  Assessment & Plan:   Assessment: Poor keratoma versus verrucoid lesion sub-fifth metatarsal head of the right foot.  Plan: We discussed etiology pathology conservative versus surgical therapies.  At this point we have decided to excise the lesion and sent for pathologic evaluation.  This was performed after 2 cc of a 50-50 mixture of Marcaine plain and lidocaine plain was injected sublingually.  The area was prepped and draped as normal sterile fashion.  Utilizing a 15 blade I circumferentially incised the lesion and curetted it separating the dermal epidermal junction.  This appears to be more of a verrucoid lesion and then a poor keratoma.  At this point applied phenol and Then Alcohol Pl., Silvadene cream Telfa pad and a dry sterile compressive dressing.  She received soaking instructions before leaving today.  I will follow-up with her in 1-2 weeks just to make sure this is healing well and we will sent for pathology.     Max T. [place], [place]

## 2016-12-20 NOTE — Patient Instructions (Signed)

## 2017-01-10 ENCOUNTER — Ambulatory Visit (INDEPENDENT_AMBULATORY_CARE_PROVIDER_SITE_OTHER): Payer: BLUE CROSS/BLUE SHIELD | Admitting: Podiatry

## 2017-01-10 ENCOUNTER — Encounter: Payer: Self-pay | Admitting: Podiatry

## 2017-01-10 DIAGNOSIS — B070 Plantar wart: Secondary | ICD-10-CM

## 2017-01-12 NOTE — Progress Notes (Signed)
She presents today for follow-up of curettage of plantar wart times 3-week states that is a little sore but doing okay.  She states that he has felt much better since we removed it.  She is referring to the area beneath the fifth metatarsal on the right foot.  Objective: Vital signs are stable alert and oriented x3.  Pulses are palpable.  Pathology report did demonstrate an involuting verruca plantaris.  Today's exam demonstrates well healing epithelializing wound beneath the fifth metatarsal head of the right foot.  It appears to be healing in very nicely there is no signs of bacterial infection.  Assessment: Well-healing surgical foot right.  Plan: Follow-up with me on an as-needed basis continue current therapies to heal this wound 100% if there is any recurrence notify us immediately.

## 2017-01-16 DIAGNOSIS — D2261 Melanocytic nevi of right upper limb, including shoulder: Secondary | ICD-10-CM | POA: Diagnosis not present

## 2017-01-16 DIAGNOSIS — L253 Unspecified contact dermatitis due to other chemical products: Secondary | ICD-10-CM | POA: Diagnosis not present

## 2017-01-16 DIAGNOSIS — L720 Epidermal cyst: Secondary | ICD-10-CM | POA: Diagnosis not present

## 2017-01-16 DIAGNOSIS — D2262 Melanocytic nevi of left upper limb, including shoulder: Secondary | ICD-10-CM | POA: Diagnosis not present

## 2017-03-19 ENCOUNTER — Encounter: Payer: Self-pay | Admitting: Podiatry

## 2017-03-19 ENCOUNTER — Ambulatory Visit (INDEPENDENT_AMBULATORY_CARE_PROVIDER_SITE_OTHER): Payer: BLUE CROSS/BLUE SHIELD | Admitting: Podiatry

## 2017-03-19 DIAGNOSIS — Q828 Other specified congenital malformations of skin: Secondary | ICD-10-CM

## 2017-03-19 NOTE — Progress Notes (Signed)
She presents today states that the wart is growing back as she refers to the plantar aspect of the fifth metatarsal of the right foot.  Objective: Vital signs are stable she is alert and oriented x3.  Pulses are palpable.  She has a small reactive hyperkeratotic lesion was debrided does not demonstrate any signs of wart to the plantar aspect of the fifth metatarsal head right foot.  This is the same area where he removed one from previously I feel we can stand to wash this for a while.  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  Plan: Debrided reactive hyperkeratosis.

## 2017-09-09 DIAGNOSIS — Z130 Encounter for screening for diseases of the blood and blood-forming organs and certain disorders involving the immune mechanism: Secondary | ICD-10-CM | POA: Diagnosis not present

## 2017-09-09 DIAGNOSIS — Z1231 Encounter for screening mammogram for malignant neoplasm of breast: Secondary | ICD-10-CM | POA: Diagnosis not present

## 2017-09-09 DIAGNOSIS — Z6833 Body mass index (BMI) 33.0-33.9, adult: Secondary | ICD-10-CM | POA: Diagnosis not present

## 2017-09-09 DIAGNOSIS — Z124 Encounter for screening for malignant neoplasm of cervix: Secondary | ICD-10-CM | POA: Diagnosis not present

## 2017-09-09 DIAGNOSIS — Z1389 Encounter for screening for other disorder: Secondary | ICD-10-CM | POA: Diagnosis not present

## 2017-09-09 DIAGNOSIS — Z01419 Encounter for gynecological examination (general) (routine) without abnormal findings: Secondary | ICD-10-CM | POA: Diagnosis not present

## 2017-09-10 DIAGNOSIS — Z124 Encounter for screening for malignant neoplasm of cervix: Secondary | ICD-10-CM | POA: Diagnosis not present

## 2017-09-10 LAB — RESULTS CONSOLE HPV: CHL HPV: NEGATIVE

## 2017-09-10 LAB — HM PAP SMEAR: HM Pap smear: NEGATIVE

## 2017-09-13 ENCOUNTER — Other Ambulatory Visit: Payer: Self-pay | Admitting: Obstetrics and Gynecology

## 2017-09-13 DIAGNOSIS — N6320 Unspecified lump in the left breast, unspecified quadrant: Secondary | ICD-10-CM

## 2017-09-19 ENCOUNTER — Ambulatory Visit
Admission: RE | Admit: 2017-09-19 | Discharge: 2017-09-19 | Disposition: A | Discharge status: Home / Self Care | Payer: BLUE CROSS/BLUE SHIELD | Source: Ambulatory Visit | Attending: Obstetrics and Gynecology | Admitting: Obstetrics and Gynecology

## 2017-09-19 ENCOUNTER — Other Ambulatory Visit: Payer: Self-pay | Admitting: Obstetrics and Gynecology

## 2017-09-19 DIAGNOSIS — N6320 Unspecified lump in the left breast, unspecified quadrant: Secondary | ICD-10-CM

## 2017-09-19 DIAGNOSIS — R922 Inconclusive mammogram: Secondary | ICD-10-CM | POA: Diagnosis not present

## 2018-02-14 ENCOUNTER — Other Ambulatory Visit: Payer: Self-pay | Admitting: Family Medicine

## 2018-02-14 MED ORDER — AMOXICILLIN 875 MG PO TABS: 875.0000 mg | ORAL_TABLET | Freq: Two times a day (BID) | ORAL | 0 refills | Status: AC

## 2018-02-19 DIAGNOSIS — D2262 Melanocytic nevi of left upper limb, including shoulder: Secondary | ICD-10-CM | POA: Diagnosis not present

## 2018-02-19 DIAGNOSIS — D225 Melanocytic nevi of trunk: Secondary | ICD-10-CM | POA: Diagnosis not present

## 2018-02-19 DIAGNOSIS — L218 Other seborrheic dermatitis: Secondary | ICD-10-CM | POA: Diagnosis not present

## 2018-02-19 DIAGNOSIS — D2261 Melanocytic nevi of right upper limb, including shoulder: Secondary | ICD-10-CM | POA: Diagnosis not present

## 2018-03-21 ENCOUNTER — Other Ambulatory Visit: Payer: Self-pay

## 2018-03-21 ENCOUNTER — Ambulatory Visit
Admission: RE | Admit: 2018-03-21 | Discharge: 2018-03-21 | Disposition: A | Discharge status: Home / Self Care | Payer: BLUE CROSS/BLUE SHIELD | Source: Ambulatory Visit | Attending: Obstetrics and Gynecology | Admitting: Obstetrics and Gynecology

## 2018-03-21 ENCOUNTER — Other Ambulatory Visit: Payer: Self-pay | Admitting: Obstetrics and Gynecology

## 2018-03-21 DIAGNOSIS — N6320 Unspecified lump in the left breast, unspecified quadrant: Secondary | ICD-10-CM

## 2018-09-23 ENCOUNTER — Other Ambulatory Visit: Payer: BLUE CROSS/BLUE SHIELD

## 2018-09-24 ENCOUNTER — Ambulatory Visit
Admission: RE | Admit: 2018-09-24 | Discharge: 2018-09-24 | Disposition: A | Discharge status: Home / Self Care | Payer: BC Managed Care – PPO | Source: Ambulatory Visit | Attending: Obstetrics and Gynecology | Admitting: Obstetrics and Gynecology

## 2018-09-24 ENCOUNTER — Other Ambulatory Visit: Payer: Self-pay

## 2018-09-24 ENCOUNTER — Other Ambulatory Visit: Payer: Self-pay | Admitting: Obstetrics and Gynecology

## 2018-09-24 ENCOUNTER — Ambulatory Visit
Admission: RE | Admit: 2018-09-24 | Discharge: 2018-09-24 | Disposition: A | Discharge status: Home / Self Care | Payer: BLUE CROSS/BLUE SHIELD | Source: Ambulatory Visit | Attending: Obstetrics and Gynecology | Admitting: Obstetrics and Gynecology

## 2018-09-24 DIAGNOSIS — N6310 Unspecified lump in the right breast, unspecified quadrant: Secondary | ICD-10-CM

## 2018-09-24 DIAGNOSIS — N6321 Unspecified lump in the left breast, upper outer quadrant: Secondary | ICD-10-CM | POA: Diagnosis not present

## 2018-09-24 DIAGNOSIS — N6320 Unspecified lump in the left breast, unspecified quadrant: Secondary | ICD-10-CM

## 2018-09-24 DIAGNOSIS — R922 Inconclusive mammogram: Secondary | ICD-10-CM | POA: Diagnosis not present

## 2018-09-24 DIAGNOSIS — N6323 Unspecified lump in the left breast, lower outer quadrant: Secondary | ICD-10-CM | POA: Diagnosis not present

## 2018-09-24 DIAGNOSIS — N6001 Solitary cyst of right breast: Secondary | ICD-10-CM | POA: Diagnosis not present

## 2018-10-07 ENCOUNTER — Ambulatory Visit (INDEPENDENT_AMBULATORY_CARE_PROVIDER_SITE_OTHER): Payer: BC Managed Care – PPO

## 2018-10-07 ENCOUNTER — Other Ambulatory Visit: Payer: Self-pay

## 2018-10-07 DIAGNOSIS — Z23 Encounter for immunization: Secondary | ICD-10-CM

## 2018-10-29 DIAGNOSIS — Z130 Encounter for screening for diseases of the blood and blood-forming organs and certain disorders involving the immune mechanism: Secondary | ICD-10-CM | POA: Diagnosis not present

## 2018-10-29 DIAGNOSIS — Z1389 Encounter for screening for other disorder: Secondary | ICD-10-CM | POA: Diagnosis not present

## 2018-10-29 DIAGNOSIS — Z6831 Body mass index (BMI) 31.0-31.9, adult: Secondary | ICD-10-CM | POA: Diagnosis not present

## 2018-10-29 DIAGNOSIS — Z01419 Encounter for gynecological examination (general) (routine) without abnormal findings: Secondary | ICD-10-CM | POA: Diagnosis not present

## 2018-11-11 ENCOUNTER — Encounter: Payer: BLUE CROSS/BLUE SHIELD | Admitting: Family Medicine

## 2018-12-08 ENCOUNTER — Encounter: Payer: BC Managed Care – PPO | Admitting: Family Medicine

## 2019-03-10 ENCOUNTER — Encounter: Payer: BC Managed Care – PPO | Admitting: Family Medicine

## 2019-03-24 ENCOUNTER — Encounter: Payer: Self-pay | Admitting: Podiatry

## 2019-03-24 ENCOUNTER — Ambulatory Visit: Payer: 59 | Admitting: Podiatry

## 2019-03-24 ENCOUNTER — Other Ambulatory Visit: Payer: Self-pay

## 2019-03-24 VITALS — Temp 96.8°F

## 2019-03-24 DIAGNOSIS — Q828 Other specified congenital malformations of skin: Secondary | ICD-10-CM

## 2019-03-24 DIAGNOSIS — M7751 Other enthesopathy of right foot: Secondary | ICD-10-CM

## 2019-03-24 NOTE — Progress Notes (Signed)
She presents today for follow-up of her right foot with a reoccurring painful lesion.  She states that it is started back about 6 months ago and has been painful ever since.  Objective: Vital signs are stable she is alert and oriented x3 pulses are palpable.  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  Reactive hyperkeratotic lesion/porokeratosis.  Plan: Discussed etiology pathology conservative versus surgical therapies I went ahead and injected the area today for bursitis with 2 mg of dexamethasone and local anesthetic she tolerated that procedure well I also went ahead and performed some chemical destruction after mechanical destruction.  And I discussed conservative padding options with her if this fails we may need to consider orthotics.

## 2019-04-10 ENCOUNTER — Telehealth: Payer: Self-pay | Admitting: Podiatry

## 2019-04-10 NOTE — Telephone Encounter (Signed)
I don't have my account number handy, but I was calling about a bill. More like an insurance coding question related to services with Dr. Milinda Pointer on 03/23 of this year. If you can call me back, I would appreciate it. My number is [phone]. Thank you.

## 2019-04-17 ENCOUNTER — Telehealth: Payer: Self-pay | Admitting: Podiatry

## 2019-04-17 NOTE — Telephone Encounter (Signed)
My account number is [id number] and I had talked to you last week about my insurance covering or aknowledging one of the procedure codes Dr. Milinda Pointer used on my foot. I think you were going to talk to him about possibly modifying the code so insurance would possibly pay for this. Just wanted to follow up on that. If you would call me back at [phone] I would appreciate it. Thank you. Bye bye.

## 2019-05-05 ENCOUNTER — Ambulatory Visit: Payer: 59 | Admitting: Podiatry

## 2019-05-11 ENCOUNTER — Encounter: Payer: 59 | Admitting: Family Medicine

## 2019-05-14 ENCOUNTER — Other Ambulatory Visit: Payer: Self-pay

## 2019-05-14 ENCOUNTER — Ambulatory Visit (INDEPENDENT_AMBULATORY_CARE_PROVIDER_SITE_OTHER): Payer: 59 | Admitting: Family Medicine

## 2019-05-14 ENCOUNTER — Encounter: Payer: Self-pay | Admitting: Family Medicine

## 2019-05-14 VITALS — BP 106/70 | HR 82 | Temp 98.3°F | Resp 12 | Ht 65.5 in | Wt 169.0 lb

## 2019-05-14 DIAGNOSIS — M67431 Ganglion, right wrist: Secondary | ICD-10-CM | POA: Insufficient documentation

## 2019-05-14 DIAGNOSIS — Z800 Family history of malignant neoplasm of digestive organs: Secondary | ICD-10-CM | POA: Diagnosis not present

## 2019-05-14 DIAGNOSIS — Z0001 Encounter for general adult medical examination with abnormal findings: Secondary | ICD-10-CM

## 2019-05-14 DIAGNOSIS — Z0000 Encounter for general adult medical examination without abnormal findings: Secondary | ICD-10-CM

## 2019-05-14 DIAGNOSIS — I781 Nevus, non-neoplastic: Secondary | ICD-10-CM | POA: Insufficient documentation

## 2019-05-14 DIAGNOSIS — Z1211 Encounter for screening for malignant neoplasm of colon: Secondary | ICD-10-CM

## 2019-05-14 NOTE — Progress Notes (Signed)
Subjective:    Patient ID: Tracy Owen, female    DOB: [birth year], 44 y.o.   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  Is recommended that she have repeat diagnostic mammogram of the left breast ultrasound in September 2021   GYN Dr. Paula Compton  - PAP Smear UTD, no abnormal PAP Smear     -  Mirena IUD in place for contraception     Dentist every  6 months, no major issues   Eye doctor - for glasses    Followed by podiatry Dr. Milinda Pointer, for bursitis of 5th right digit , and hyperakotic lesion, recommended orthotics as needed    [facility] yearly    Walking, exercise regularly       Father has had multiple pre cancerous polyps removed, recommended that she have early  Colonoscopy - has family history of colon cancer in maternal aunt       Dr. Benson [place]    Need biometric screening labs   She has a ganglion cyst on her right wrist that has been present for quite some time but she wants to defer on having this surgically removed at this time  Review Of Systems:  GEN- denies fatigue, fever, weight loss,weakness, recent illness HEENT- denies eye drainage, change in vision, nasal discharge, CVS- denies chest pain, palpitations RESP- denies SOB, cough, wheeze ABD- denies N/V, change in stools, abd pain GU- denies dysuria, hematuria, dribbling, incontinence MSK- denies joint pain, muscle aches, injury Neuro- denies headache, dizziness, syncope, seizure activity       Objective:    BP 106/70 (BP Location: Right Arm, Patient Position: Sitting, Cuff Size: Normal)   Pulse 82   Temp 98.3 F (36.8 C) (Temporal)   Resp 12   Ht 5' 5.5" (1.664 m)   Wt 169 lb (76.7 kg)   SpO2 100%   BMI 27.70 kg/m  GEN- NAD, alert and  oriented x3 HEENT- PERRL, EOMI, non injected sclera, pink conjunctiva, MMM, oropharynx clear Neck- Supple, no thyromegaly CVS- RRR, no murmur RESP-CTAB ABD-NABS,soft,NT,ND EXT- No edema ,spider veins bilat LE Psych- normal affect and mood  MSK- Right hand - ganglion cyst at wrist, FROM Wrist  Pulses- Radial, DP- 2+  CAGE/FALL/DEPRESSION screen neg       Assessment & Plan:      Problem List Items Addressed This Visit    None    Visit Diagnoses    Routine general medical examination at a health care facility    -  Primary   CPE done, fasting labs obtained, biometric screening form to be done, referral for colonoscopy   I will obtain records from her GYN for PAP Smear   Repeat mammogram in Sept  No current pain or swelling from spider veins,will monitor for now, can use compression hose as well   Ganglion cyst on right wrist, pt prefers to hold on treatment for now    Relevant Orders   CBC with Differential/Platelet   Comprehensive metabolic panel   Lipid panel   TSH   Family history of colon cancer       Relevant Orders   Ambulatory referral to Gastroenterology   Colon cancer screening  Relevant Orders   Ambulatory referral to Gastroenterology      Note: This dictation was prepared with Dragon dictation along with smaller phrase technology. Any transcriptional errors that result from this process are unintentional.

## 2019-05-14 NOTE — Patient Instructions (Addendum)
Repeat [facility] mammogram and left ultrasound in September 2021 Referral to GI for colonoscopy  F/U 1 year for physical

## 2019-05-15 LAB — CBC WITH DIFFERENTIAL/PLATELET
Absolute Monocytes: 414 cells/uL (ref 200–950)
Basophils Absolute: 41 cells/uL (ref 0–200)
Basophils Relative: 0.9 %
Eosinophils Absolute: 59 cells/uL (ref 15–500)
Eosinophils Relative: 1.3 %
HCT: 42.3 % (ref 35.0–45.0)
Hemoglobin: 14 g/dL (ref 11.7–15.5)
Lymphs Abs: 1233 cells/uL (ref 850–3900)
MCH: 31 pg (ref 27.0–33.0)
MCHC: 33.1 g/dL (ref 32.0–36.0)
MCV: 93.8 fL (ref 80.0–100.0)
MPV: 12 fL (ref 7.5–12.5)
Monocytes Relative: 9.2 %
Neutro Abs: 2754 cells/uL (ref 1500–7800)
Neutrophils Relative %: 61.2 %
Platelets: 223 10*3/uL (ref 140–400)
RBC: 4.51 10*6/uL (ref 3.80–5.10)
RDW: 12.6 % (ref 11.0–15.0)
Total Lymphocyte: 27.4 %
WBC: 4.5 10*3/uL (ref 3.8–10.8)

## 2019-05-15 LAB — COMPREHENSIVE METABOLIC PANEL
AG Ratio: 1.8 (calc) (ref 1.0–2.5)
ALT: 20 U/L (ref 6–29)
AST: 17 U/L (ref 10–30)
Albumin: 4.5 g/dL (ref 3.6–5.1)
Alkaline phosphatase (APISO): 76 U/L (ref 31–125)
BUN: 13 mg/dL (ref 7–25)
CO2: 26 mmol/L (ref 20–32)
Calcium: 9.6 mg/dL (ref 8.6–10.2)
Chloride: 104 mmol/L (ref 98–110)
Creat: 0.64 mg/dL (ref 0.50–1.10)
Globulin: 2.5 g/dL (calc) (ref 1.9–3.7)
Glucose, Bld: 86 mg/dL (ref 65–99)
Potassium: 4.3 mmol/L (ref 3.5–5.3)
Sodium: 139 mmol/L (ref 135–146)
Total Bilirubin: 0.7 mg/dL (ref 0.2–1.2)
Total Protein: 7 g/dL (ref 6.1–8.1)

## 2019-05-15 LAB — LIPID PANEL
Cholesterol: 106 mg/dL (ref <200)
HDL: 43 mg/dL — ABNORMAL LOW (ref >=50)
LDL Cholesterol (Calc): 50 mg/dL (calc)
Non-HDL Cholesterol (Calc): 63 mg/dL (calc) (ref <130)
Total CHOL/HDL Ratio: 2.5 (calc) (ref <5.0)
Triglycerides: 53 mg/dL (ref <150)

## 2019-05-15 LAB — TSH: TSH: 1.53 mIU/L

## 2019-07-09 ENCOUNTER — Ambulatory Visit (INDEPENDENT_AMBULATORY_CARE_PROVIDER_SITE_OTHER): Payer: No Typology Code available for payment source | Admitting: Nurse Practitioner

## 2019-07-09 ENCOUNTER — Other Ambulatory Visit: Payer: Self-pay

## 2019-07-09 VITALS — BP 110/68 | HR 79 | Temp 98.4°F | Resp 18 | Wt 156.0 lb

## 2019-07-09 DIAGNOSIS — S39012A Strain of muscle, fascia and tendon of lower back, initial encounter: Secondary | ICD-10-CM | POA: Diagnosis not present

## 2019-07-09 DIAGNOSIS — M5440 Lumbago with sciatica, unspecified side: Secondary | ICD-10-CM | POA: Diagnosis not present

## 2019-07-09 MED ORDER — PREDNISONE 10 MG PO TABS: ORAL_TABLET | ORAL | 0 refills | Status: DC

## 2019-07-09 NOTE — Progress Notes (Signed)
Established Patient Office Visit  Subjective:  Patient ID: Tracy Owen, female    DOB: April 28, 1975  Age: 44 y.o. MRN: [id number]  CC:  Chief Complaint  Patient presents with  . Back Pain    reaching into car and felt like a spasm in back, getting worst, pain radiating down R leg with some numbness, ibuprofen was taken, started 07/06    HPI Tracy Owen is a 44 year old female presenting for symptoms of right lumbar pain that started Tuesday after bending over to get something out of her vehicle. The pain radiates down into her right leg. The sxs has continued and is the same today. She has tried Ibuprofen every 6-8 hours at 800 mg and using a heating pad with temporary relief. Today the sxs have gotten to the point that she came to clinic for attention, wondering if there may be something else that can be done to help relieve the sxs and further more resolve her sxs. No other sxs reported such as change in gu/gi pattern, fever/chills. Past Medical History:  Diagnosis Date  . GERD (gastroesophageal reflux disease)    no meds- pregnancy related    Past Surgical History:  Procedure Laterality Date  . CESAREAN SECTION  2007  . CESAREAN SECTION  03/29/2011   Procedure: CESAREAN SECTION;  Surgeon: Logan Bores, MD;  Location: [facility];  Service: Gynecology;  Laterality: N/A;  repeat    Family History  Problem Relation Age of Onset  . Breast cancer Paternal Aunt   . Cancer Paternal Aunt        Breast and colon  . Breast cancer Paternal Aunt   . Cancer Paternal Aunt        breast  . Colonic polyp Father   . Dementia Maternal Grandmother   . Heart disease Maternal Grandfather   . Hyperlipidemia Maternal Grandfather   . Hypertension Maternal Grandfather   . COPD Paternal Grandmother   . Cancer Paternal Grandfather        lung cancer    Social History   Socioeconomic History  . Marital status: Married    Spouse name: Not on file  . Number of children: 2  . Years of  education: Not on file  . Highest education level: Not on file  Occupational History  . Not on file  Tobacco Use  . Smoking status: Never Smoker  . Smokeless tobacco: Never Used  Vaping Use  . Vaping Use: Never used  Substance and Sexual Activity  . Alcohol use: Not Currently    Comment: occ  . Drug use: No  . Sexual activity: Yes  Other Topics Concern  . Not on file  Social History Narrative  . Not on file   Social Determinants of Health   Financial Resource Strain:   . Difficulty of Paying Living Expenses:   Food Insecurity:   . Worried About Charity fundraiser in the Last Year:   . Arboriculturist in the Last Year:   Transportation Needs:   . Film/video editor (Medical):   Marland Kitchen Lack of Transportation (Non-Medical):   Physical Activity:   . Days of Exercise per Week:   . Minutes of Exercise per Session:   Stress:   . Feeling of Stress :   Social Connections:   . Frequency of Communication with Friends and Family:   . Frequency of Social Gatherings with Friends and Family:   . Attends Religious Services:   .  Active Member of Clubs or Organizations:   . Attends Archivist Meetings:   Marland Kitchen Marital Status:   Intimate Partner Violence:   . Fear of Current or Ex-Partner:   . Emotionally Abused:   Marland Kitchen Physically Abused:   . Sexually Abused:     Outpatient Medications Prior to Visit  Medication Sig Dispense Refill  . levonorgestrel (MIRENA, 52 MG,) 20 MCG/24HR IUD Mirena 20 mcg/24 hr (5 years) intrauterine device  Take 1 device by intrauterine route.     No facility-administered medications prior to visit.    Allergies  Allergen Reactions  . Latex   . Sulfa Antibiotics Rash    ROS Review of Systems  All other systems reviewed and are negative.     Objective:    Physical Exam Vitals and nursing note reviewed.  Constitutional:      General: She is not in acute distress.    Appearance: Normal appearance. She is well-developed and well-groomed.  She is not ill-appearing or toxic-appearing.  HENT:     Head: Normocephalic.     Right Ear: Hearing normal.     Left Ear: Hearing normal.  Eyes:     General: Lids are normal. Lids are everted, no foreign bodies appreciated.     Extraocular Movements: Extraocular movements intact.     Conjunctiva/sclera: Conjunctivae normal.     Pupils: Pupils are equal, round, and reactive to light.  Cardiovascular:     Rate and Rhythm: Normal rate.  Pulmonary:     Effort: Pulmonary effort is normal.  Musculoskeletal:     Cervical back: Neck supple.     Lumbar back: Tenderness present. No swelling, edema, deformity, signs of trauma, lacerations, spasms or bony tenderness. Normal range of motion. No scoliosis.       Back:     Right lower leg: No edema.     Left lower leg: No edema.  Skin:    Coloration: Skin is not jaundiced or pale.  Neurological:     General: No focal deficit present.     Mental Status: She is alert and oriented to person, place, and time.  Psychiatric:        Mood and Affect: Mood normal.        Behavior: Behavior normal. Behavior is cooperative.     BP 110/68 (BP Location: Left Arm, Patient Position: Sitting, Cuff Size: Normal)   Pulse 79   Temp 98.4 F (36.9 C) (Temporal)   Resp 18   Wt 156 lb (70.8 kg)   SpO2 99%   BMI 25.56 kg/m  Wt Readings from Last 3 Encounters:  07/09/19 156 lb (70.8 kg)  05/14/19 169 lb (76.7 kg)  03/29/11 189 lb (85.7 kg)     Health Maintenance Due  Topic Date Due  . Hepatitis C Screening  Never done    There are no preventive care reminders to display for this patient.  Lab Results  Component Value Date   TSH 1.53 05/14/2019   Lab Results  Component Value Date   WBC 4.5 05/14/2019   HGB 14.0 05/14/2019   HCT 42.3 05/14/2019   MCV 93.8 05/14/2019   PLT 223 05/14/2019   Lab Results  Component Value Date   NA 139 05/14/2019   K 4.3 05/14/2019   CO2 26 05/14/2019   GLUCOSE 86 05/14/2019   BUN 13 05/14/2019    CREATININE 0.64 05/14/2019   BILITOT 0.7 05/14/2019   AST 17 05/14/2019   ALT 20 05/14/2019   PROT  7.0 05/14/2019   CALCIUM 9.6 05/14/2019   Lab Results  Component Value Date   CHOL 106 05/14/2019   Lab Results  Component Value Date   HDL 43 (L) 05/14/2019   Lab Results  Component Value Date   LDLCALC 50 05/14/2019   Lab Results  Component Value Date   TRIG 53 05/14/2019   Lab Results  Component Value Date   CHOLHDL 2.5 05/14/2019   No results found for: HGBA1C    Assessment & Plan:   Problem List Items Addressed This Visit    None    Visit Diagnoses    Back pain of lumbar region with sciatica    -  Primary   Relevant Medications   predniSONE (DELTASONE) 10 MG tablet   Strain of lumbar region, initial encounter        Symptoms are consistant with right lumbar strain with sciatica.  You should rest but as discussed avoid staying in one position too long. Lumbar stretches be of benefit in decreasing symptoms.  Continue to use Ibuprofen as you have 600-800 mg every 8 hours on full stomach or if not possible may take with Pepcid 20 mg to protect Gastrointestinal tract. You may alternate Ibuprofen with Tylenol 1000 mg every 8 hour as needed however must be taken 4 hours apart from Ibuprofen dose.  Example if you take Ibuprofen at 8 AM you may Take Tylenol at 12 noon then Ibuprofen again at 4 PM then Tylenol again at 8PM.   Use Ice alternating Heat to help relieve discomfort.   Take prescription steroid as directed.   Educational printout uploaded to be available in [facility] for pts viewing.  Meds ordered this encounter  Medications  . predniSONE (DELTASONE) 10 MG tablet    Sig: Day one take 6 tablets oraly Day two take 5 tablets oraly Day three take 4 tablets oraly Day four take 3 tablets oraly Day five take 2 tablets oraly Day six take 1 tablet oraly    Dispense:  21 tablet    Refill:  0    Follow-up: Return if symptoms worsen or fail to improve.     Annie Main, FNP

## 2019-07-09 NOTE — Patient Instructions (Signed)
Symptoms are consistant with right lumbar strain with sciatica.  You should rest but as discussed avoid staying in one position too long. Lumbar stretches be of benefit in decreasing symptoms.  Continue to use Ibuprofen as you have 600-800 mg every 8 hours on full stomach or if not possible may take with Pepcid 20 mg to protect Gastrointestinal tract. You may alternate Ibuprofen with Tylenol 1000 mg every 8 hour as needed however must be taken 4 hours apart from Ibuprofen dose.  Example if you take Ibuprofen at 8 AM you may Take Tylenol at 12 noon then Ibuprofen again at 4 PM then Tylenol again at 8PM.   Use Ice alternating Heat to help relieve discomfort.   Take prescription steroid as directed.

## 2019-07-27 ENCOUNTER — Other Ambulatory Visit: Payer: Self-pay | Admitting: Obstetrics and Gynecology

## 2019-07-27 DIAGNOSIS — D242 Benign neoplasm of left breast: Secondary | ICD-10-CM

## 2019-09-10 ENCOUNTER — Encounter: Payer: Self-pay | Admitting: Family Medicine

## 2019-09-25 ENCOUNTER — Other Ambulatory Visit: Payer: Self-pay

## 2019-09-25 ENCOUNTER — Ambulatory Visit
Admission: RE | Admit: 2019-09-25 | Discharge: 2019-09-25 | Disposition: A | Discharge status: Home / Self Care | Payer: No Typology Code available for payment source | Source: Ambulatory Visit | Attending: Obstetrics and Gynecology | Admitting: Obstetrics and Gynecology

## 2019-09-25 DIAGNOSIS — D242 Benign neoplasm of left breast: Secondary | ICD-10-CM

## 2019-12-09 ENCOUNTER — Other Ambulatory Visit: Payer: Self-pay | Admitting: Family Medicine

## 2020-05-16 ENCOUNTER — Encounter: Payer: 59 | Admitting: Family Medicine

## 2020-07-05 ENCOUNTER — Other Ambulatory Visit: Payer: Self-pay | Admitting: Nurse Practitioner

## 2020-07-05 ENCOUNTER — Other Ambulatory Visit: Payer: Self-pay | Admitting: *Deleted

## 2020-07-05 DIAGNOSIS — N6320 Unspecified lump in the left breast, unspecified quadrant: Secondary | ICD-10-CM

## 2020-07-07 ENCOUNTER — Ambulatory Visit
Admission: RE | Admit: 2020-07-07 | Discharge: 2020-07-07 | Disposition: A | Discharge status: Home / Self Care | Payer: No Typology Code available for payment source | Source: Ambulatory Visit | Attending: Nurse Practitioner | Admitting: Nurse Practitioner

## 2020-07-07 ENCOUNTER — Other Ambulatory Visit: Payer: Self-pay

## 2020-07-07 DIAGNOSIS — N6320 Unspecified lump in the left breast, unspecified quadrant: Secondary | ICD-10-CM | POA: Insufficient documentation

## 2020-07-08 ENCOUNTER — Other Ambulatory Visit: Payer: Self-pay | Admitting: Nurse Practitioner

## 2020-07-08 DIAGNOSIS — R928 Other abnormal and inconclusive findings on diagnostic imaging of breast: Secondary | ICD-10-CM

## 2020-07-13 ENCOUNTER — Other Ambulatory Visit: Payer: Self-pay

## 2020-07-13 ENCOUNTER — Ambulatory Visit
Admission: RE | Admit: 2020-07-13 | Discharge: 2020-07-13 | Disposition: A | Discharge status: Home / Self Care | Payer: No Typology Code available for payment source | Source: Ambulatory Visit | Attending: Nurse Practitioner | Admitting: Nurse Practitioner

## 2020-07-13 DIAGNOSIS — R928 Other abnormal and inconclusive findings on diagnostic imaging of breast: Secondary | ICD-10-CM

## 2020-07-13 HISTORY — PX: BREAST BIOPSY: SHX20

## 2020-07-14 LAB — SURGICAL PATHOLOGY

## 2020-08-01 ENCOUNTER — Other Ambulatory Visit: Payer: Self-pay | Admitting: Obstetrics and Gynecology

## 2020-08-01 ENCOUNTER — Other Ambulatory Visit: Payer: Self-pay | Admitting: Nurse Practitioner

## 2020-08-01 DIAGNOSIS — Z1231 Encounter for screening mammogram for malignant neoplasm of breast: Secondary | ICD-10-CM

## 2020-08-10 ENCOUNTER — Other Ambulatory Visit: Payer: No Typology Code available for payment source

## 2020-10-03 ENCOUNTER — Other Ambulatory Visit: Payer: Self-pay

## 2020-10-03 ENCOUNTER — Ambulatory Visit
Admission: RE | Admit: 2020-10-03 | Discharge: 2020-10-03 | Disposition: A | Discharge status: Home / Self Care | Payer: No Typology Code available for payment source | Source: Ambulatory Visit | Attending: Obstetrics and Gynecology | Admitting: Obstetrics and Gynecology

## 2020-10-03 DIAGNOSIS — Z1231 Encounter for screening mammogram for malignant neoplasm of breast: Secondary | ICD-10-CM | POA: Insufficient documentation

## 2020-10-05 ENCOUNTER — Encounter: Payer: Self-pay | Admitting: Nurse Practitioner

## 2020-10-05 ENCOUNTER — Other Ambulatory Visit: Payer: Self-pay

## 2020-10-05 ENCOUNTER — Ambulatory Visit (INDEPENDENT_AMBULATORY_CARE_PROVIDER_SITE_OTHER): Payer: No Typology Code available for payment source | Admitting: Nurse Practitioner

## 2020-10-05 VITALS — BP 118/62 | HR 66 | Temp 99.5°F | Resp 14 | Ht 66.0 in | Wt 144.0 lb

## 2020-10-05 DIAGNOSIS — Z13220 Encounter for screening for lipoid disorders: Secondary | ICD-10-CM

## 2020-10-05 DIAGNOSIS — Z0000 Encounter for general adult medical examination without abnormal findings: Secondary | ICD-10-CM | POA: Diagnosis not present

## 2020-10-05 DIAGNOSIS — Z136 Encounter for screening for cardiovascular disorders: Secondary | ICD-10-CM

## 2020-10-05 NOTE — Progress Notes (Signed)
BP 118/62   Pulse 66   Temp 99.5 F (37.5 C) (Temporal)   Resp 14   Ht 5\' 6"  (1.676 m)   Wt 144 lb (65.3 kg)   LMP  (LMP Unknown)   SpO2 97%   BMI 23.24 kg/m    Subjective:    Patient ID: ALESHIA CARTELLI, female    DOB: 1975-07-05, 45 y.o.   MRN: [id number]  HPI: ANJEANETTE PETZOLD is a 45 y.o. female presenting on 10/05/2020 for comprehensive medical examination. Current medical complaints include:  None; she has been doing [place] and has lost a significant amount of weight.  She is now only using it for maintenance therapy.  She does have spider veins in her lower extremities bilaterally that are not very bothersome.  She has compression stockings but does not wear them regularly.  She currently lives with: husband, 2 children; have 1 dog LMP: none with Mirena; follows with OB/GYN  Depression Screen done today and results listed below:  Depression screen [facility] 2/9 10/05/2020 05/14/2019  Decreased Interest 0 0  Down, Depressed, Hopeless 0 0  PHQ - 2 Score 0 0  Altered sleeping - 0  Tired, decreased energy - 0  Change in appetite - 0  Feeling bad or failure about yourself  - 0  Trouble concentrating - 0  Moving slowly or fidgety/restless - 0  Suicidal thoughts - 0  PHQ-9 Score - 0  Difficult doing work/chores - Not difficult at all   Past Medical History:  Past Medical History:  Diagnosis Date   GERD (gastroesophageal reflux disease)    no meds- pregnancy related    Surgical History:  Past Surgical History:  Procedure Laterality Date   BREAST BIOPSY Left 07/13/2020   ribbon marker 9:00, BENIGN BREAST PARENCHYMA WITH PROMINENT PERIDUCTAL CHRONIC INFLAMMATION AND DUCTAL HISTIOCYTIC REACTION CONSISTENT WITH DUCT   CESAREAN SECTION  01/01/2005   CESAREAN SECTION  03/29/2011   Procedure: CESAREAN SECTION;  Surgeon: Logan Bores, MD;  Location: [facility];  Service: Gynecology;  Laterality: N/A;  repeat    Medications:  Current Outpatient Medications on File Prior to  Visit  Medication Sig   levonorgestrel (MIRENA) 20 MCG/24HR IUD Mirena 20 mcg/24 hr (5 years) intrauterine device  Take 1 device by intrauterine route.   No current facility-administered medications on file prior to visit.    Allergies:  Allergies  Allergen Reactions   Latex    Sulfa Antibiotics Rash    Social History:  Social History   Socioeconomic History   Marital status: Married    Spouse name: Not on file   Number of children: 2   Years of education: Not on file   Highest education level: Not on file  Occupational History   Not on file  Tobacco Use   Smoking status: Never   Smokeless tobacco: Never  Vaping Use   Vaping Use: Never used  Substance and Sexual Activity   Alcohol use: Not Currently    Comment: occ   Drug use: No   Sexual activity: Yes  Other Topics Concern   Not on file  Social History Narrative   Not on file   Social Determinants of Health   Financial Resource Strain: Not on file  Food Insecurity: Not on file  Transportation Needs: Not on file  Physical Activity: Not on file  Stress: Not on file  Social Connections: Not on file  Intimate Partner Violence: Not on file   Social History   Tobacco  Use  Smoking Status Never  Smokeless Tobacco Never   Social History   Substance and Sexual Activity  Alcohol Use Not Currently   Comment: occ    Family History:  Family History  Problem Relation Age of Onset   Breast cancer Paternal Aunt    Cancer Paternal Aunt        Breast and colon   Breast cancer Paternal Aunt    Cancer Paternal Aunt        breast   Colonic polyp Father    Dementia Maternal Grandmother    Heart disease Maternal Grandfather    Hyperlipidemia Maternal Grandfather    Hypertension Maternal Grandfather    COPD Paternal Grandmother    Cancer Paternal Grandfather        lung cancer    Past medical history, surgical history, medications, allergies, family history and social history reviewed with patient today and  changes made to appropriate areas of the chart.   Review of Systems  Constitutional: Negative.  Negative for fever and weight loss.  HENT: Negative.    Eyes: Negative.  Negative for blurred vision and double vision.  Respiratory: Negative.  Negative for cough and wheezing.   Cardiovascular: Negative.  Negative for chest pain, palpitations and leg swelling.  Gastrointestinal: Negative.  Negative for abdominal pain, blood in stool, constipation, diarrhea and heartburn.  Skin: Negative.  Negative for itching and rash.  Neurological: Negative.  Negative for dizziness and headaches.  Psychiatric/Behavioral: Negative.  Negative for depression.       Objective:    BP 118/62   Pulse 66   Temp 99.5 F (37.5 C) (Temporal)   Resp 14   Ht 5\' 6"  (1.676 m)   Wt 144 lb (65.3 kg)   LMP  (LMP Unknown)   SpO2 97%   BMI 23.24 kg/m   Wt Readings from Last 3 Encounters:  10/05/20 144 lb (65.3 kg)  07/09/19 156 lb (70.8 kg)  05/14/19 169 lb (76.7 kg)    Physical Exam Vitals and nursing note reviewed.  Constitutional:      General: She is not in acute distress.    Appearance: Normal appearance. She is normal weight. She is not ill-appearing or toxic-appearing.  HENT:     Head: Normocephalic and atraumatic.     Right Ear: Tympanic membrane, ear canal and external ear normal.     Left Ear: Tympanic membrane, ear canal and external ear normal.     Nose: Nose normal. No congestion or rhinorrhea.     Mouth/Throat:     Mouth: Mucous membranes are moist.     Pharynx: Oropharynx is clear. No oropharyngeal exudate.  Eyes:     General: No scleral icterus.    Extraocular Movements: Extraocular movements intact.     Pupils: Pupils are equal, round, and reactive to light.  Cardiovascular:     Rate and Rhythm: Normal rate and regular rhythm.     Pulses: Normal pulses.     Heart sounds: Normal heart sounds. No murmur heard. Pulmonary:     Effort: Pulmonary effort is normal. No respiratory distress.      Breath sounds: No wheezing or rhonchi.  Chest:     Comments: Gynecologic examination deferred using shared decision making; has upcoming OB/GYN appointment Abdominal:     General: Abdomen is flat. Bowel sounds are normal. There is no distension.     Palpations: Abdomen is soft.     Tenderness: There is no abdominal tenderness.  Musculoskeletal:  General: No swelling or tenderness. Normal range of motion.     Cervical back: Normal range of motion and neck supple. No rigidity or tenderness.     Right lower leg: No edema.     Left lower leg: No edema.     Right ankle: Swelling present. No ecchymosis. No tenderness. Normal range of motion. Normal pulse.     Left ankle: Normal.     Comments: Swelling to lateral malleolus; nontender to palpation  Skin:    General: Skin is warm and dry.     Capillary Refill: Capillary refill takes less than 2 seconds.     Coloration: Skin is not jaundiced or pale.     Findings: No erythema.  Neurological:     General: No focal deficit present.     Mental Status: She is alert and oriented to person, place, and time.     Motor: No weakness.     Gait: Gait normal.  Psychiatric:        Mood and Affect: Mood normal.        Behavior: Behavior normal.        Thought Content: Thought content normal.        Judgment: Judgment normal.      Assessment & Plan:   Problem List Items Addressed This Visit   None Visit Diagnoses     Routine general medical examination at a health care facility    -  Primary   Relevant Orders   CBC with Differential/Platelet   COMPLETE METABOLIC PANEL WITH GFR   Lipid panel   Encounter for lipid screening for cardiovascular disease       Relevant Orders   CBC with Differential/Platelet   COMPLETE METABOLIC PANEL WITH GFR   Lipid panel        Follow up plan: Return in about 1 year (around 10/05/2021) for CPE with fasting labs.   LABORATORY TESTING:  - Pap smear: done elsewhere  IMMUNIZATIONS:   - Tdap:  Tetanus vaccination status reviewed: last tetanus booster within 10 years. - Influenza: Given elsewhere - Pneumovax: Not applicable - Prevnar: Not applicable - HPV: Not applicable - Zostavax vaccine: Not applicable - [id number]-[id number] vaccine: Has had 2 doses with 1 booster; thinking about bivalent and I did recommend she get this  SCREENING: -Mammogram: Up to date  - Colonoscopy: Up to date  - Bone Density: Not applicable  -Hearing Test: Not applicable  -Spirometry: Not applicable   PATIENT COUNSELING:  Advised to avoid cigarette smoking.  I discussed with the patient that most people either abstain from alcohol or drink within safe limits (<=14/week and <=4 drinks/occasion for males, <=7/weeks and <= 3 drinks/occasion for females) and that the risk for alcohol disorders and other health effects rises proportionally with the number of drinks per week and how often a drinker exceeds daily limits.  Discussed cessation/primary prevention of drug use and availability of treatment for abuse.   Diet: Encouraged to adjust caloric intake to maintain  or achieve ideal body weight, to reduce intake of dietary saturated fat and total fat, to limit sodium intake by avoiding high sodium foods and not adding table salt, and to maintain adequate dietary potassium and calcium preferably from fresh fruits, vegetables, and low-fat dairy products.    stressed the importance of regular exercise  Injury prevention: Discussed safety belts, safety helmets, smoke detector, smoking near bedding or upholstery.   Dental health: Discussed importance of regular tooth brushing, flossing, and dental visits.  NEXT PREVENTATIVE PHYSICAL DUE IN 1 YEAR. Return in about 1 year (around 10/05/2021) for CPE with fasting labs.

## 2020-10-06 ENCOUNTER — Encounter: Payer: Self-pay | Admitting: *Deleted

## 2020-10-06 LAB — COMPLETE METABOLIC PANEL WITH GFR
AG Ratio: 2.2 (calc) (ref 1.0–2.5)
ALT: 19 U/L (ref 6–29)
AST: 15 U/L (ref 10–30)
Albumin: 4.7 g/dL (ref 3.6–5.1)
Alkaline phosphatase (APISO): 51 U/L (ref 31–125)
BUN: 15 mg/dL (ref 7–25)
CO2: 26 mmol/L (ref 20–32)
Calcium: 9.4 mg/dL (ref 8.6–10.2)
Chloride: 104 mmol/L (ref 98–110)
Creat: 0.6 mg/dL (ref 0.50–0.99)
Globulin: 2.1 g/dL (calc) (ref 1.9–3.7)
Glucose, Bld: 81 mg/dL (ref 65–99)
Potassium: 4.2 mmol/L (ref 3.5–5.3)
Sodium: 139 mmol/L (ref 135–146)
Total Bilirubin: 0.8 mg/dL (ref 0.2–1.2)
Total Protein: 6.8 g/dL (ref 6.1–8.1)
eGFR: 114 mL/min/{1.73_m2} (ref >=60)

## 2020-10-06 LAB — LIPID PANEL
Cholesterol: 137 mg/dL (ref <200)
HDL: 67 mg/dL (ref >=50)
LDL Cholesterol (Calc): 59 mg/dL (calc)
Non-HDL Cholesterol (Calc): 70 mg/dL (calc) (ref <130)
Total CHOL/HDL Ratio: 2 (calc) (ref <5.0)
Triglycerides: 40 mg/dL (ref <150)

## 2020-10-06 LAB — CBC WITH DIFFERENTIAL/PLATELET
Absolute Monocytes: 385 cells/uL (ref 200–950)
Basophils Absolute: 28 cells/uL (ref 0–200)
Basophils Relative: 0.6 %
Eosinophils Absolute: 52 cells/uL (ref 15–500)
Eosinophils Relative: 1.1 %
HCT: 41.5 % (ref 35.0–45.0)
Hemoglobin: 13.4 g/dL (ref 11.7–15.5)
Lymphs Abs: 1466 cells/uL (ref 850–3900)
MCH: 30.9 pg (ref 27.0–33.0)
MCHC: 32.3 g/dL (ref 32.0–36.0)
MCV: 95.8 fL (ref 80.0–100.0)
MPV: 10.7 fL (ref 7.5–12.5)
Monocytes Relative: 8.2 %
Neutro Abs: 2768 cells/uL (ref 1500–7800)
Neutrophils Relative %: 58.9 %
Platelets: 236 10*3/uL (ref 140–400)
RBC: 4.33 10*6/uL (ref 3.80–5.10)
RDW: 11.7 % (ref 11.0–15.0)
Total Lymphocyte: 31.2 %
WBC: 4.7 10*3/uL (ref 3.8–10.8)

## 2020-11-02 ENCOUNTER — Telehealth: Payer: Self-pay | Admitting: Nurse Practitioner

## 2020-11-02 DIAGNOSIS — J019 Acute sinusitis, unspecified: Secondary | ICD-10-CM

## 2020-11-02 DIAGNOSIS — B9689 Other specified bacterial agents as the cause of diseases classified elsewhere: Secondary | ICD-10-CM

## 2020-11-02 MED ORDER — AMOXICILLIN-POT CLAVULANATE 875-125 MG PO TABS: 1.0000 | ORAL_TABLET | Freq: Two times a day (BID) | ORAL | 0 refills | Status: AC

## 2020-11-02 NOTE — Telephone Encounter (Signed)
Received message from patient's husband.  Patient has been battling sinus infection for >2 weeks and requesting Augmentin be sent into pharmacy.  Augmentin sent in.

## 2021-07-20 ENCOUNTER — Other Ambulatory Visit: Payer: Self-pay | Admitting: Obstetrics and Gynecology

## 2021-07-20 DIAGNOSIS — Z1231 Encounter for screening mammogram for malignant neoplasm of breast: Secondary | ICD-10-CM

## 2021-10-04 ENCOUNTER — Ambulatory Visit
Admission: RE | Admit: 2021-10-04 | Discharge: 2021-10-04 | Disposition: A | Discharge status: Home / Self Care | Payer: No Typology Code available for payment source | Source: Ambulatory Visit | Attending: Obstetrics and Gynecology | Admitting: Obstetrics and Gynecology

## 2021-10-04 DIAGNOSIS — Z1231 Encounter for screening mammogram for malignant neoplasm of breast: Secondary | ICD-10-CM | POA: Diagnosis not present

## 2021-10-06 ENCOUNTER — Encounter: Payer: No Typology Code available for payment source | Admitting: Nurse Practitioner

## 2022-01-19 IMAGING — US US BREAST*L* LIMITED INC AXILLA
1 series · 9 of 9 positions shown · non-contrast
Comparison: Previous exam(s).

ACR Breast Density Category a: The breast tissue is almost entirely
fatty.

CLINICAL DATA: Patient presents for palpable abnormality within the
slightly medial left breast.

EXAM:
DIGITAL DIAGNOSTIC UNILATERAL LEFT MAMMOGRAM WITH TOMOSYNTHESIS AND
CAD; ULTRASOUND LEFT BREAST LIMITED
TECHNIQUE: Left digital diagnostic mammography and breast tomosynthesis was
performed. The images were evaluated with computer-aided detection.;
Targeted ultrasound examination of the left breast was performed

[Series 1: us breast*left* limited inc axilla · 0.07mm/px · 9 of 9 slices shown]
[im 1/9]
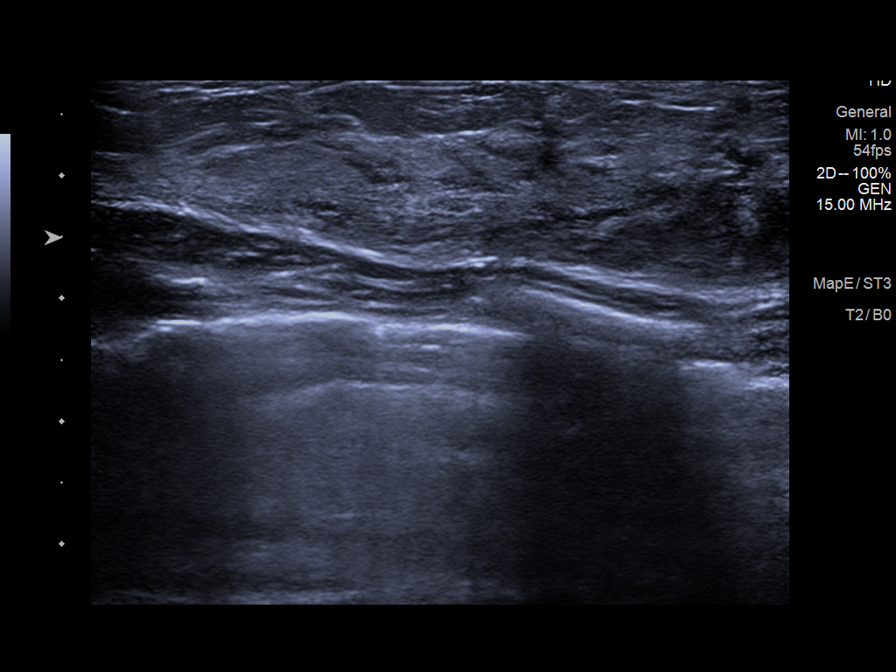
[im 2/9]
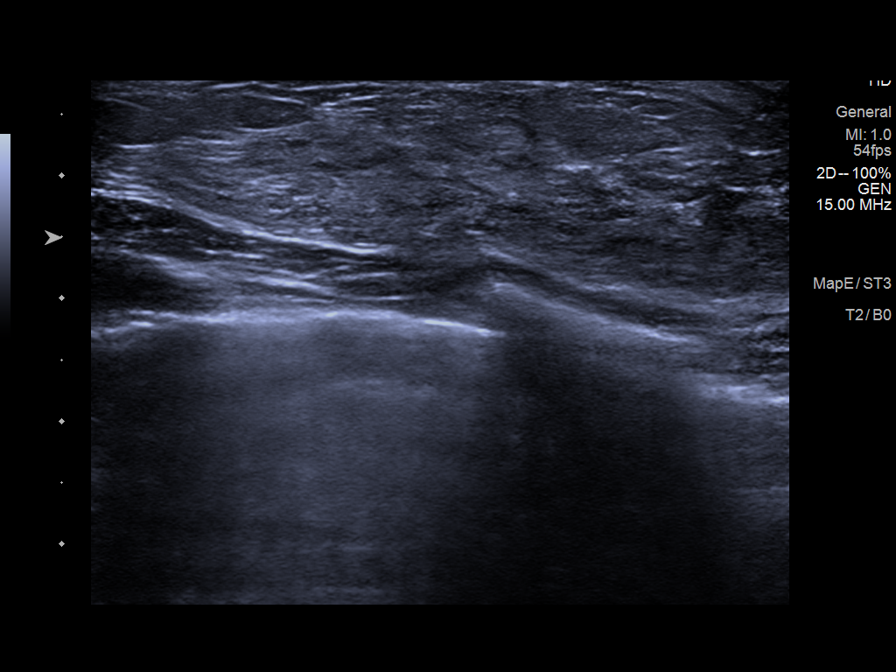
[im 3/9]
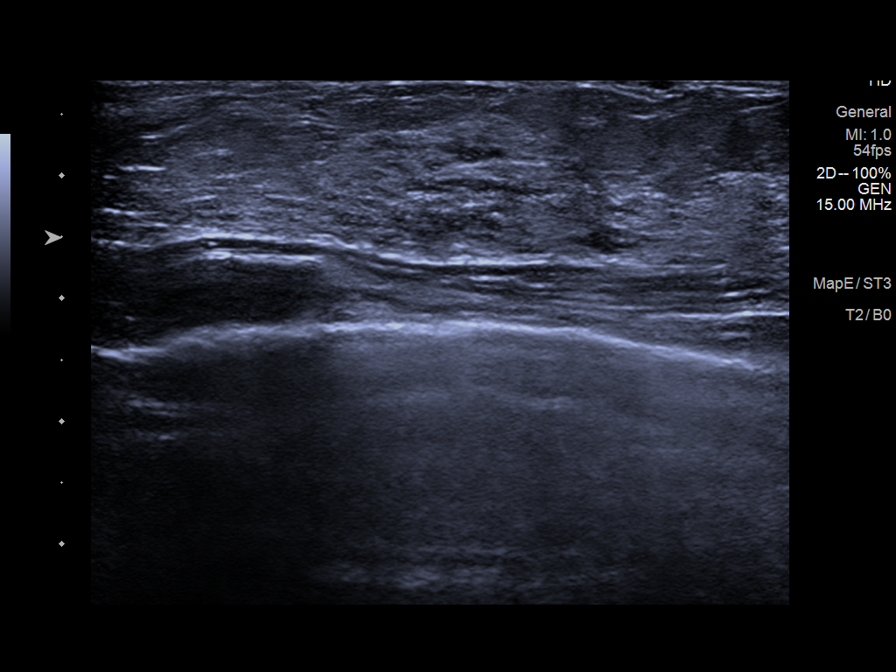
[im 4/9]
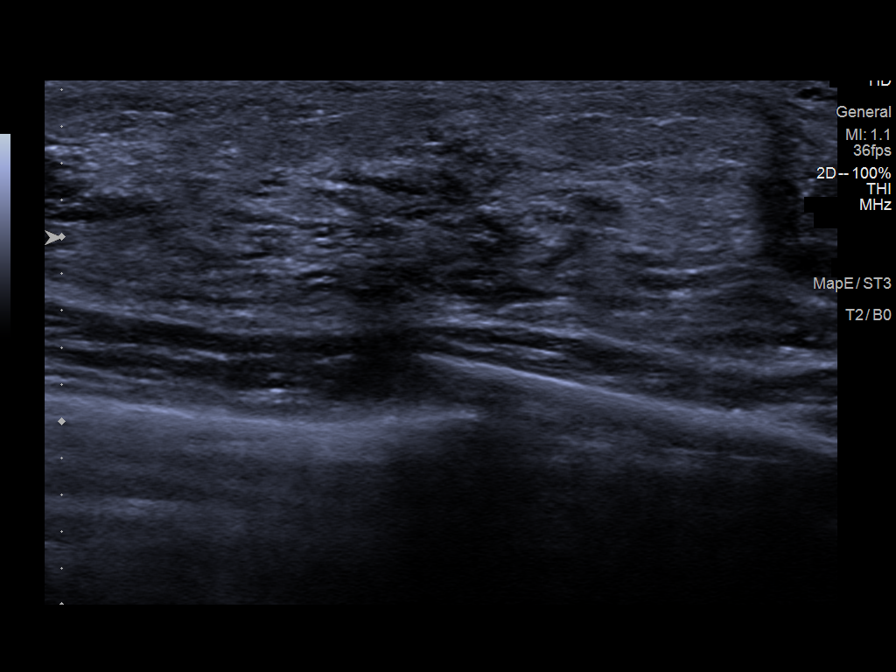
[im 5/9]
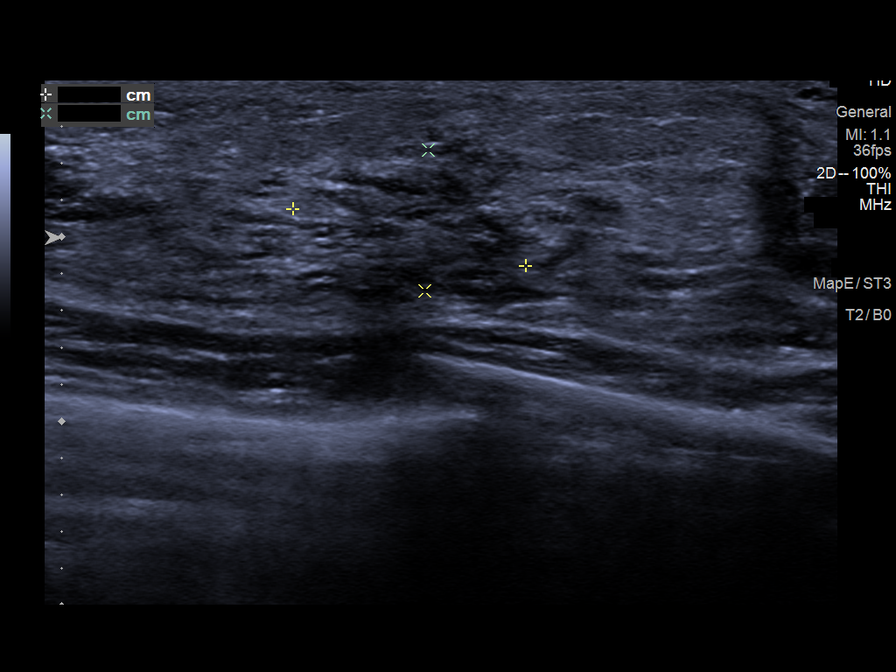
[im 6/9]
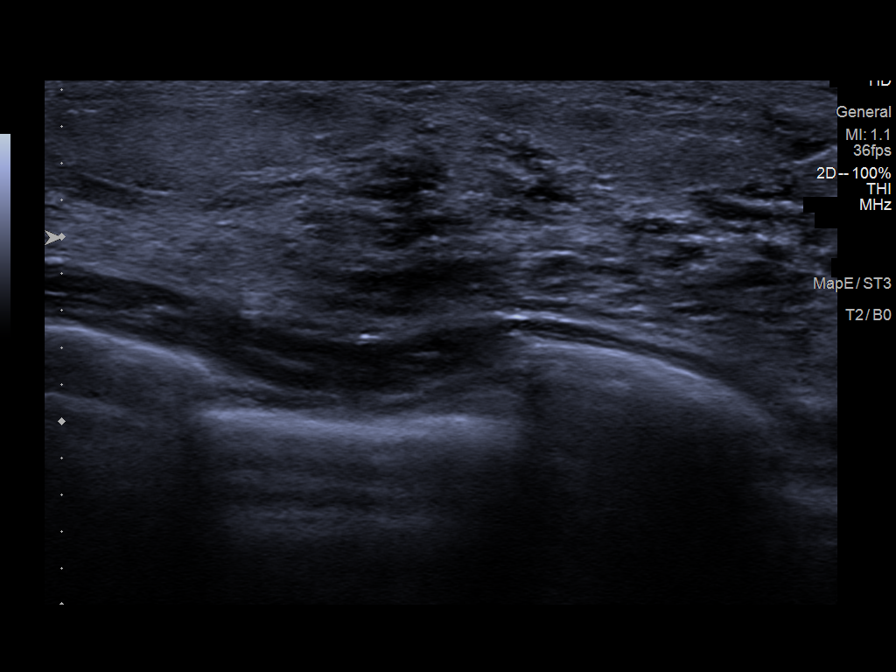
[im 7/9]
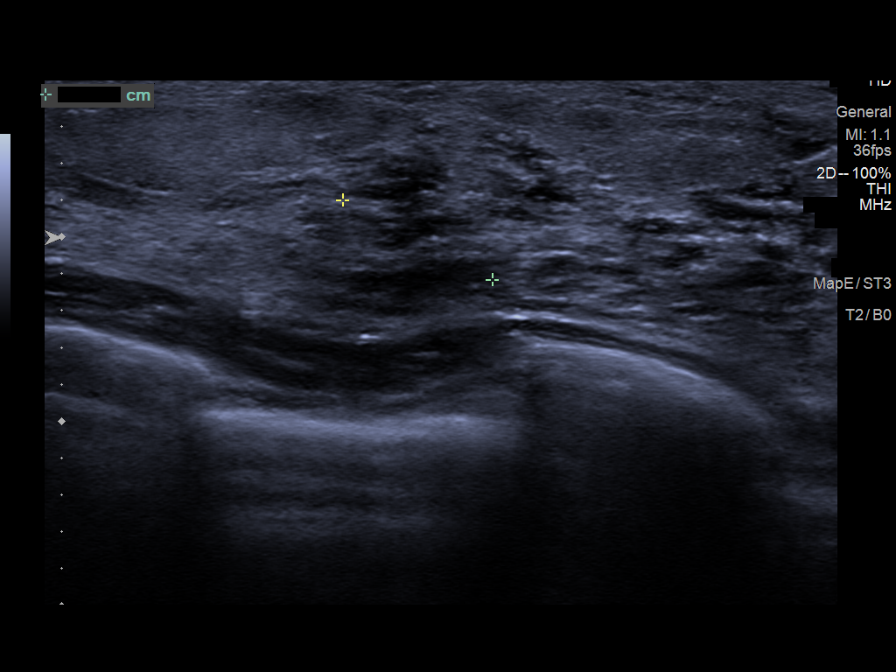
[im 8/9]
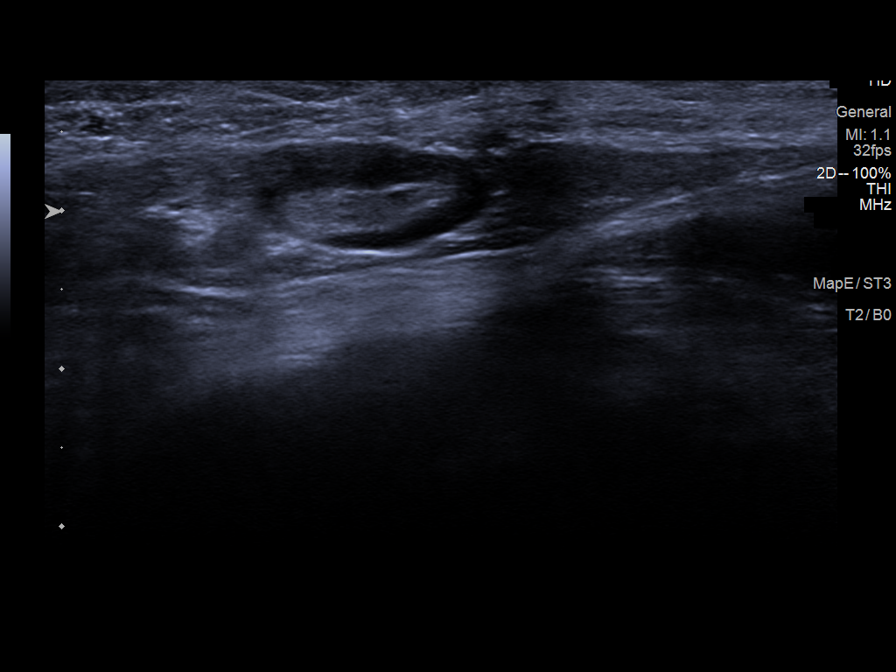
[im 9/9]
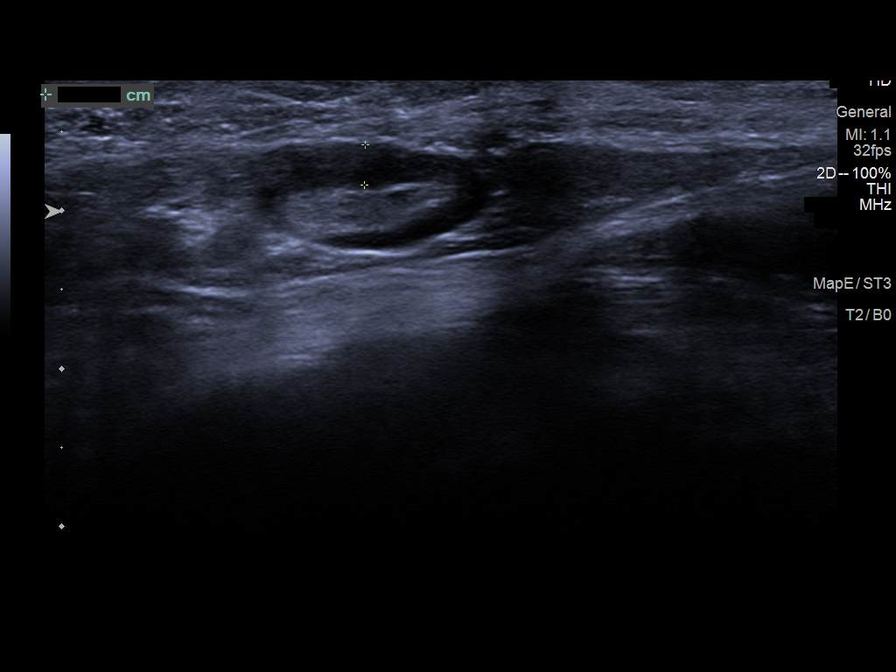

[9 of 9 positions shown; findings below may reference images not displayed]

FINDINGS: Underlying the palpable marker within the slightly medial left
breast is a persistent developing asymmetry. There is possible mild
associated distortion. No additional concerning findings within the
left breast.

On physical exam, there is a small mobile mass palpated within the
medial periareolar left breast.

Targeted ultrasound is performed, showing a 1.3 x 0.8 x 0.9 cm mixed
echogenicity structure left breast 9 o'clock position 3 cm from
nipple at the site of palpable concern. This likely corresponds with
the developing density identified on mammography.
IMPRESSION: At the site of palpable concern within the medial left breast there
is a 1.3 cm mixed echogenicity structure which likely corresponds
with the mammographically identified asymmetry. This is nonspecific
in etiology. Given the clinical history, this may represent a
focally inflamed area of tissue. Other etiologies not excluded.

RECOMMENDATION:
Ultrasound-guided core needle biopsy of the mixed echogenicity
structure left breast 9 o'clock position corresponding with the
patient's site of palpable concern for definitive diagnosis.

I have discussed the findings and recommendations with the patient.
If applicable, a reminder letter will be sent to the patient
regarding the next appointment.

BI-RADS CATEGORY  4: Suspicious.

## 2022-04-17 IMAGING — MG MM DIGITAL SCREENING BILAT W/ TOMO AND CAD
8 series · 9 of 24 positions shown · non-contrast
Comparison: Previous exam(s).

CLINICAL DATA: Screening.

EXAM:
DIGITAL SCREENING BILATERAL MAMMOGRAM WITH TOMOSYNTHESIS AND CAD
TECHNIQUE: Bilateral screening digital craniocaudal and mediolateral oblique
mammograms were obtained. Bilateral screening digital breast
tomosynthesis was performed. The images were evaluated with
computer-aided detection.

[R CC synth-2D]
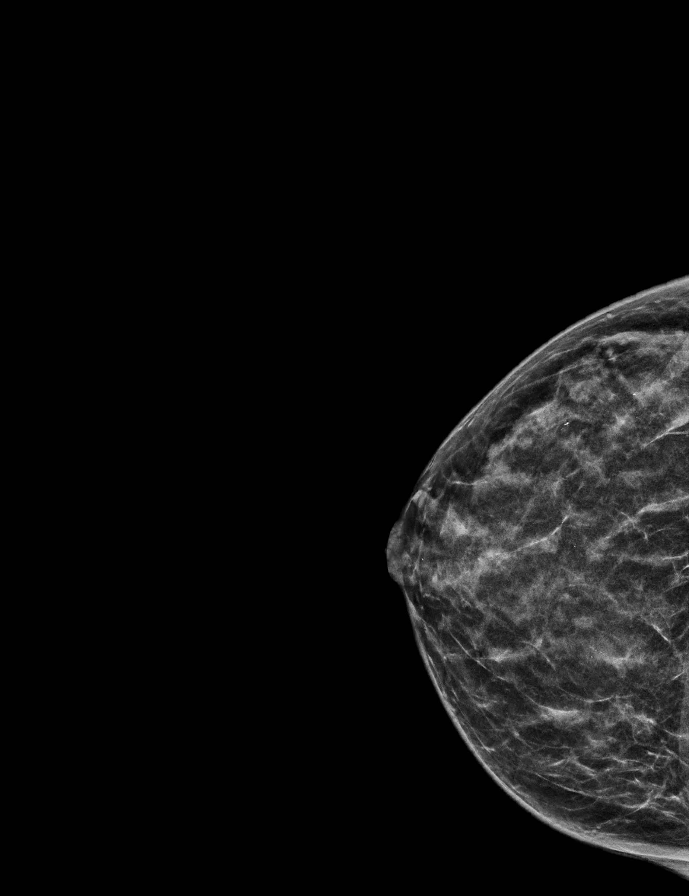

[R MLO synth-2D]
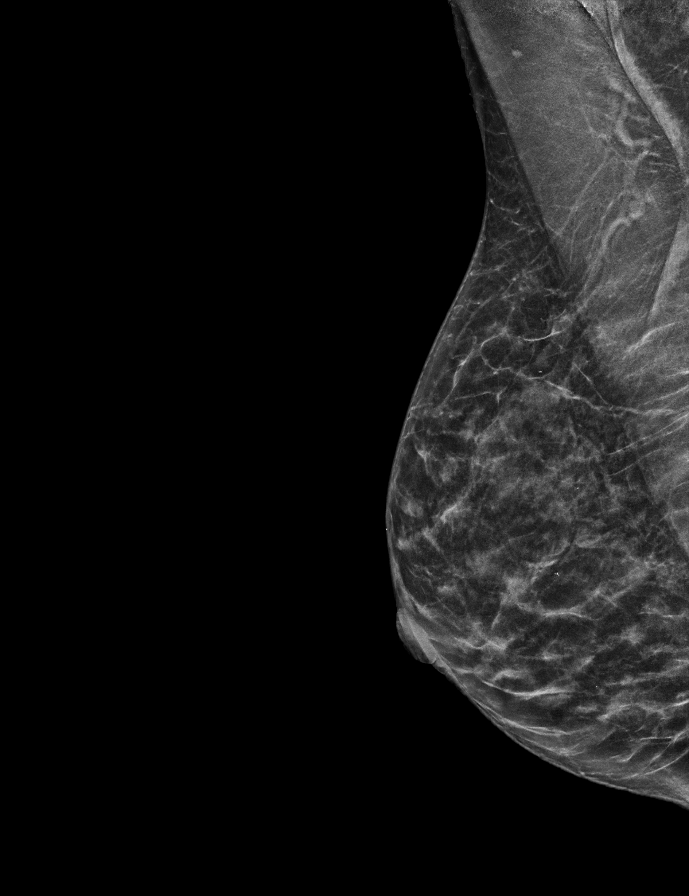

[L CC synth-2D]
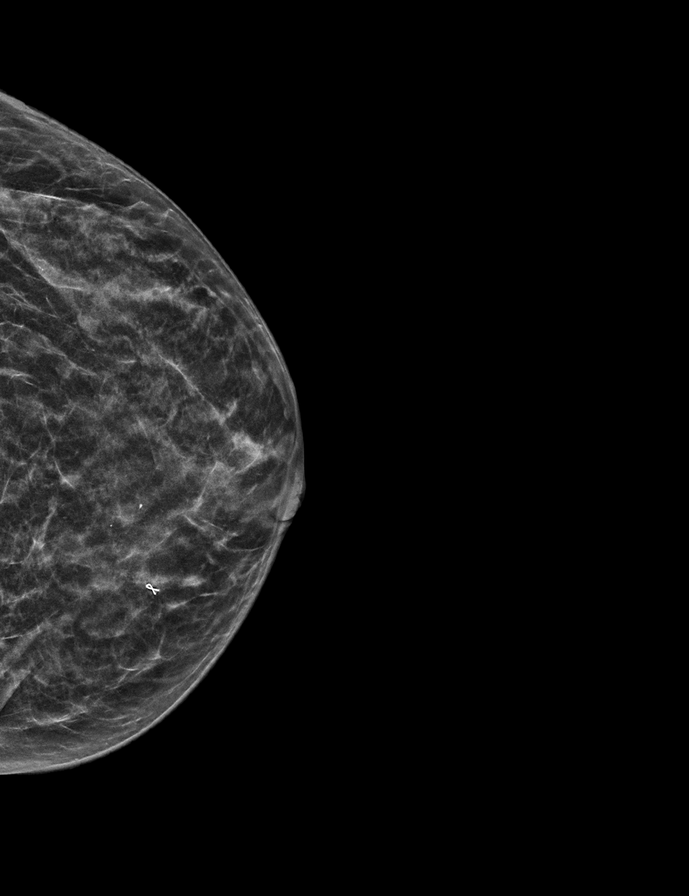

[L MLO synth-2D]
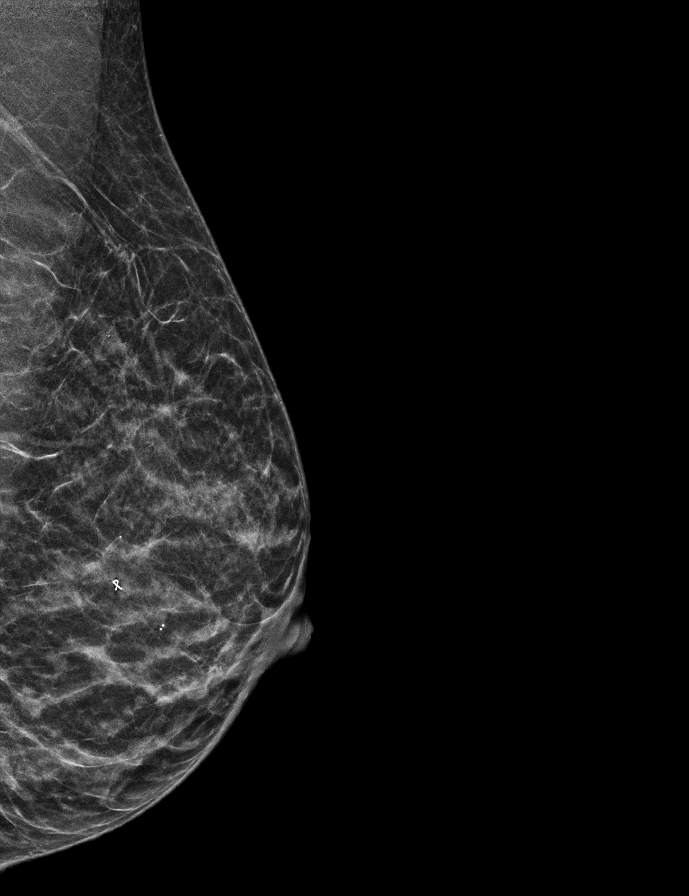

[R MLO tomo · 2 of 53 frames shown]
[frame 18/53]
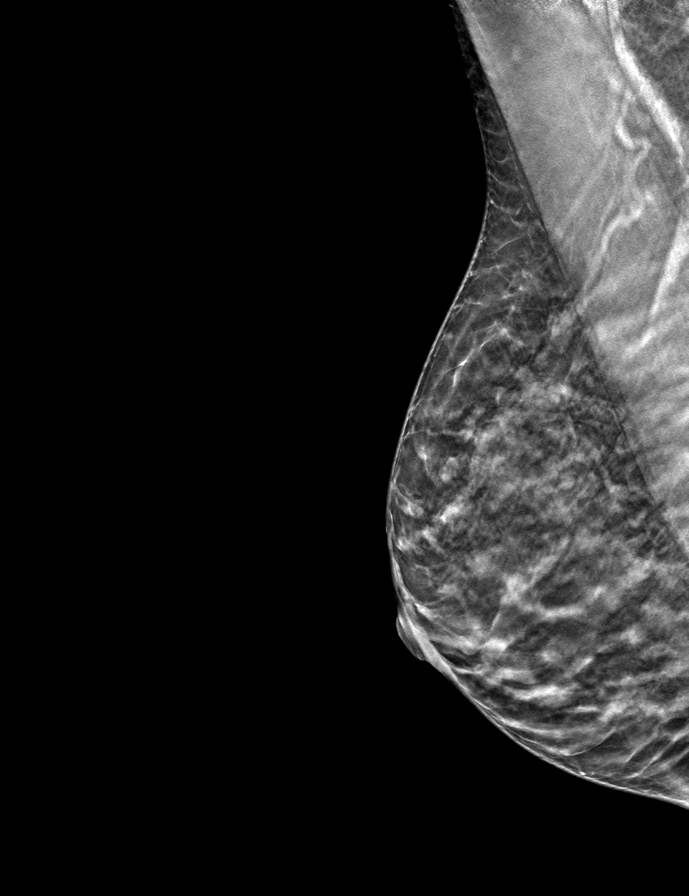
[frame 27/53]
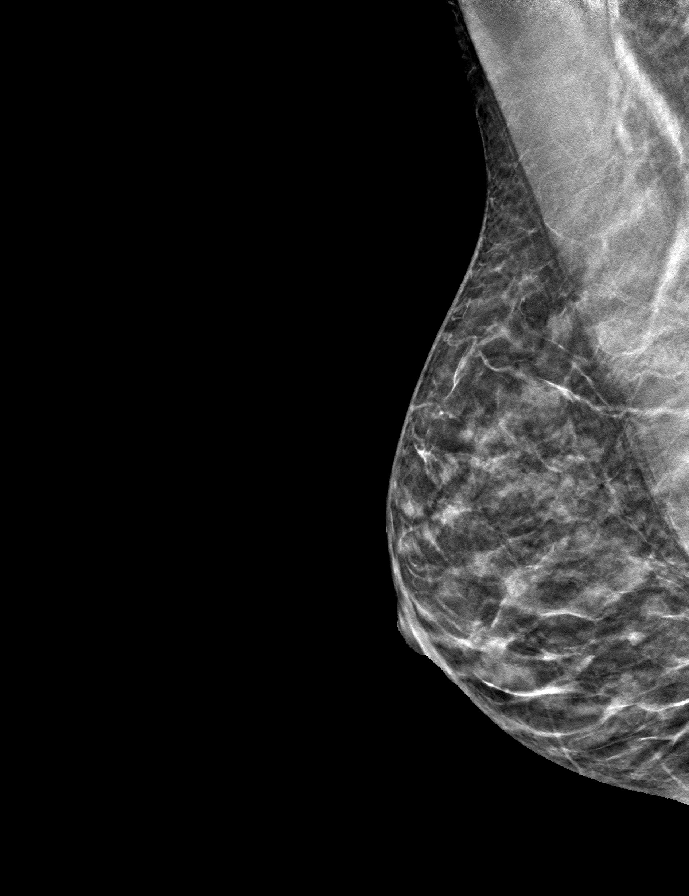

[R CC tomo · tomo slice 19/38.0]
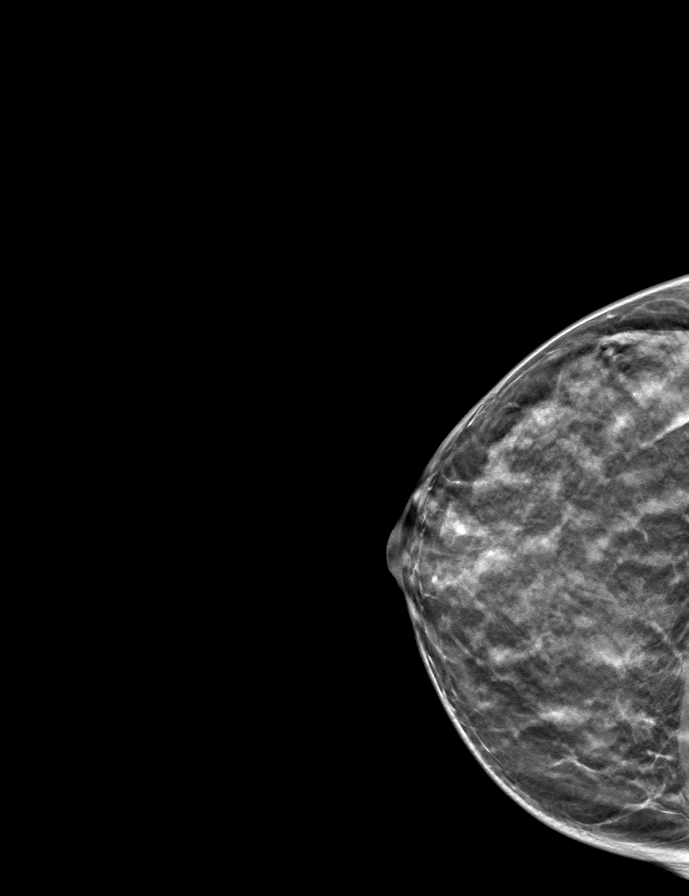

[L MLO tomo · tomo slice 18/35.0]
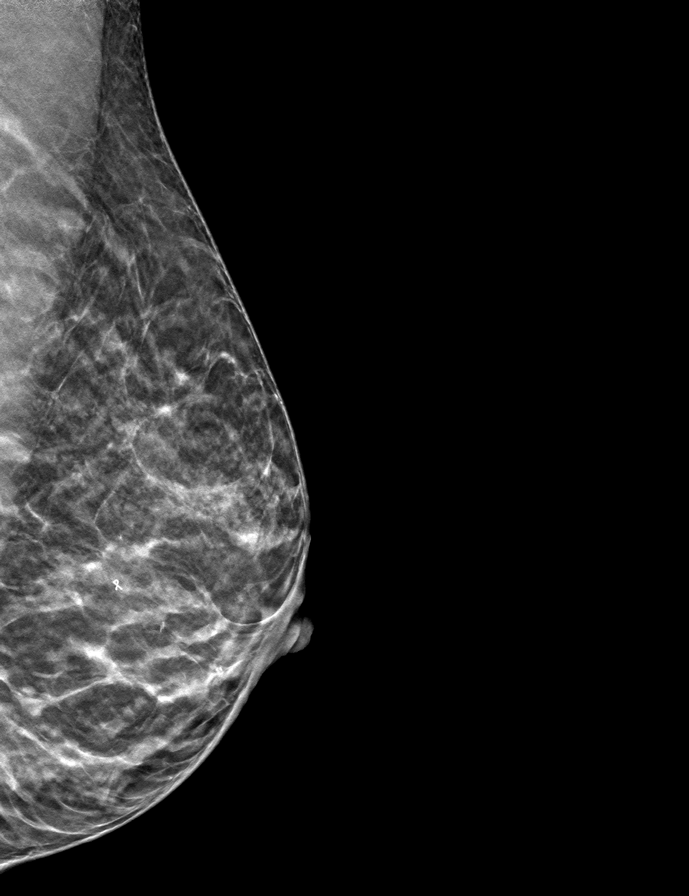

[L CC tomo · tomo slice 21/41.0]
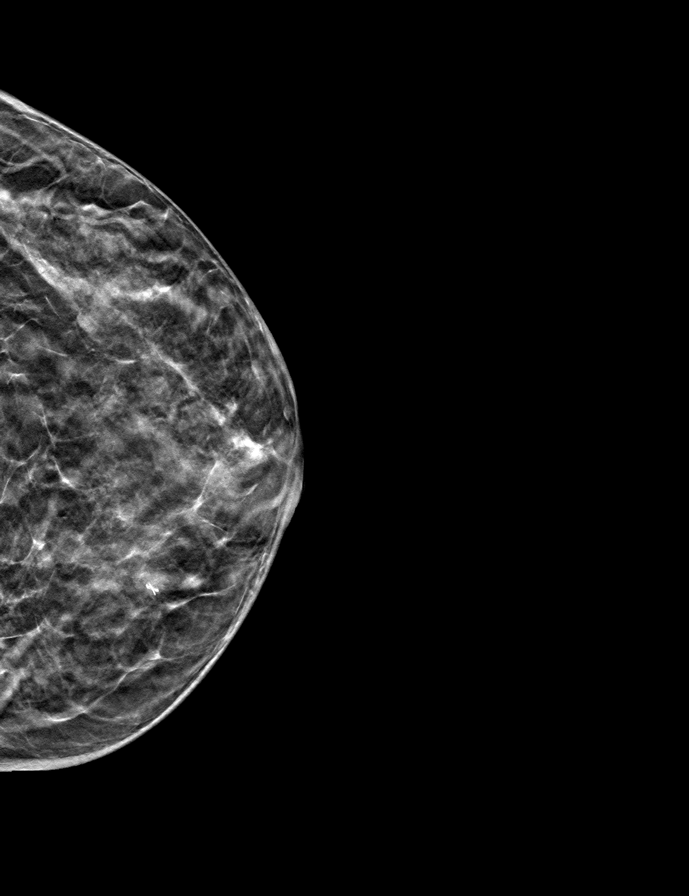

[9 of 24 positions shown; findings below may reference images not displayed]

ACR Breast Density Category c: The breast tissue is heterogeneously
dense, which may obscure small masses.
FINDINGS: There are no findings suspicious for malignancy.
IMPRESSION: No mammographic evidence of malignancy. A result letter of this
screening mammogram will be mailed directly to the patient.

RECOMMENDATION:
Screening mammogram in one year. (Code:Q3-W-BC3)

BI-RADS CATEGORY  1: Negative.

## 2022-09-05 ENCOUNTER — Other Ambulatory Visit: Payer: Self-pay | Admitting: Obstetrics and Gynecology

## 2022-09-05 DIAGNOSIS — Z1231 Encounter for screening mammogram for malignant neoplasm of breast: Secondary | ICD-10-CM

## 2022-10-09 ENCOUNTER — Ambulatory Visit
Admission: RE | Admit: 2022-10-09 | Discharge: 2022-10-09 | Disposition: A | Discharge status: Home / Self Care | Payer: No Typology Code available for payment source | Source: Ambulatory Visit | Attending: Obstetrics and Gynecology | Admitting: Obstetrics and Gynecology

## 2022-10-09 DIAGNOSIS — Z1231 Encounter for screening mammogram for malignant neoplasm of breast: Secondary | ICD-10-CM | POA: Diagnosis present

## 2022-10-11 ENCOUNTER — Encounter: Payer: Self-pay | Admitting: Obstetrics and Gynecology

## 2022-10-12 ENCOUNTER — Other Ambulatory Visit: Payer: Self-pay | Admitting: Obstetrics and Gynecology

## 2022-10-12 DIAGNOSIS — R928 Other abnormal and inconclusive findings on diagnostic imaging of breast: Secondary | ICD-10-CM

## 2022-10-12 DIAGNOSIS — N63 Unspecified lump in unspecified breast: Secondary | ICD-10-CM

## 2022-10-25 ENCOUNTER — Ambulatory Visit
Admission: RE | Admit: 2022-10-25 | Discharge: 2022-10-25 | Disposition: A | Discharge status: Home / Self Care | Payer: No Typology Code available for payment source | Source: Ambulatory Visit | Attending: Obstetrics and Gynecology | Admitting: Obstetrics and Gynecology

## 2022-10-25 DIAGNOSIS — N63 Unspecified lump in unspecified breast: Secondary | ICD-10-CM

## 2022-10-25 DIAGNOSIS — R928 Other abnormal and inconclusive findings on diagnostic imaging of breast: Secondary | ICD-10-CM | POA: Insufficient documentation

## 2023-09-13 ENCOUNTER — Other Ambulatory Visit: Payer: Self-pay | Admitting: Obstetrics and Gynecology

## 2023-09-13 DIAGNOSIS — Z1231 Encounter for screening mammogram for malignant neoplasm of breast: Secondary | ICD-10-CM

## 2023-10-14 ENCOUNTER — Ambulatory Visit
Admission: RE | Admit: 2023-10-14 | Discharge: 2023-10-14 | Disposition: A | Discharge status: Home / Self Care | Source: Ambulatory Visit | Attending: Obstetrics and Gynecology | Admitting: Obstetrics and Gynecology

## 2023-10-14 DIAGNOSIS — Z1231 Encounter for screening mammogram for malignant neoplasm of breast: Secondary | ICD-10-CM | POA: Insufficient documentation
